# Patient Record
Sex: Male | Born: 1975 | Race: White | Hispanic: Yes | Smoking: Never smoker
Health system: Southern US, Community
[De-identification: ages and names within clinical notes are randomized; demographics above are authoritative.]

## PROBLEM LIST (undated history)

## (undated) DIAGNOSIS — R319 Hematuria, unspecified: Secondary | ICD-10-CM

## (undated) DIAGNOSIS — E7800 Pure hypercholesterolemia, unspecified: Secondary | ICD-10-CM

## (undated) DIAGNOSIS — E119 Type 2 diabetes mellitus without complications: Secondary | ICD-10-CM

---

## 2010-10-09 ENCOUNTER — Emergency Department (HOSPITAL_COMMUNITY): Payer: Self-pay

## 2010-10-09 ENCOUNTER — Emergency Department (HOSPITAL_COMMUNITY)
Admission: EM | Admit: 2010-10-09 | Discharge: 2010-10-09 | Disposition: A | Discharge status: Home / Self Care | Payer: Self-pay | Attending: Emergency Medicine | Admitting: Emergency Medicine

## 2010-10-09 DIAGNOSIS — E7800 Pure hypercholesterolemia, unspecified: Secondary | ICD-10-CM | POA: Insufficient documentation

## 2010-10-09 DIAGNOSIS — R071 Chest pain on breathing: Secondary | ICD-10-CM | POA: Insufficient documentation

## 2010-10-09 DIAGNOSIS — Z79899 Other long term (current) drug therapy: Secondary | ICD-10-CM | POA: Insufficient documentation

## 2010-10-09 DIAGNOSIS — H532 Diplopia: Secondary | ICD-10-CM | POA: Insufficient documentation

## 2015-08-12 ENCOUNTER — Encounter (HOSPITAL_COMMUNITY): Payer: Self-pay | Admitting: Emergency Medicine

## 2015-08-12 ENCOUNTER — Emergency Department (HOSPITAL_COMMUNITY): Payer: Medicaid Other

## 2015-08-12 DIAGNOSIS — K3580 Unspecified acute appendicitis: Principal | ICD-10-CM | POA: Diagnosis present

## 2015-08-12 LAB — CBC
HCT: 44.4 % (ref 39.0–52.0)
HEMOGLOBIN: 14.9 g/dL (ref 13.0–17.0)
MCH: 29.9 pg (ref 26.0–34.0)
MCHC: 33.6 g/dL (ref 30.0–36.0)
MCV: 89.2 fL (ref 78.0–100.0)
Platelets: 214 10*3/uL (ref 150–400)
RBC: 4.98 MIL/uL (ref 4.22–5.81)
RDW: 13.8 % (ref 11.5–15.5)
WBC: 14 10*3/uL — AB (ref 4.0–10.5)

## 2015-08-12 LAB — BASIC METABOLIC PANEL
ANION GAP: 13 (ref 5–15)
BUN: 13 mg/dL (ref 6–20)
CALCIUM: 9.5 mg/dL (ref 8.9–10.3)
CO2: 23 mmol/L (ref 22–32)
Chloride: 104 mmol/L (ref 101–111)
Creatinine, Ser: 1.23 mg/dL (ref 0.61–1.24)
GLUCOSE: 150 mg/dL — AB (ref 65–99)
POTASSIUM: 3.9 mmol/L (ref 3.5–5.1)
SODIUM: 140 mmol/L (ref 135–145)

## 2015-08-12 LAB — I-STAT TROPONIN, ED: TROPONIN I, POC: 0 ng/mL (ref 0.00–0.08)

## 2015-08-12 NOTE — ED Notes (Signed)
The patient says he started having chest pain, epigastric pain at 1730.  He says he took some pepto bismol and it did not help.  He rates his pain 5/10.  He denies any other symptoms.

## 2015-08-13 ENCOUNTER — Observation Stay (HOSPITAL_COMMUNITY): Payer: Medicaid Other | Admitting: Anesthesiology

## 2015-08-13 ENCOUNTER — Inpatient Hospital Stay (HOSPITAL_COMMUNITY)
Admission: EM | Admit: 2015-08-13 | Discharge: 2015-08-14 | DRG: 343 | Disposition: A | Discharge status: Home / Self Care | Payer: Medicaid Other | Attending: General Surgery | Admitting: General Surgery

## 2015-08-13 ENCOUNTER — Encounter (HOSPITAL_COMMUNITY): Payer: Self-pay | Admitting: Radiology

## 2015-08-13 ENCOUNTER — Encounter (HOSPITAL_COMMUNITY): Admission: AD | Disposition: A | Discharge status: Home / Self Care | Payer: Self-pay | Source: Home / Self Care

## 2015-08-13 ENCOUNTER — Emergency Department (HOSPITAL_COMMUNITY): Payer: Medicaid Other

## 2015-08-13 DIAGNOSIS — K3580 Unspecified acute appendicitis: Secondary | ICD-10-CM | POA: Diagnosis present

## 2015-08-13 HISTORY — PX: LAPAROSCOPIC APPENDECTOMY: SHX408

## 2015-08-13 LAB — HEPATIC FUNCTION PANEL
ALT: 28 U/L (ref 17–63)
AST: 38 U/L (ref 15–41)
Albumin: 4.2 g/dL (ref 3.5–5.0)
Alkaline Phosphatase: 62 U/L (ref 38–126)
BILIRUBIN DIRECT: 0.2 mg/dL (ref 0.1–0.5)
BILIRUBIN TOTAL: 0.7 mg/dL (ref 0.3–1.2)
Indirect Bilirubin: 0.5 mg/dL (ref 0.3–0.9)
Total Protein: 7.3 g/dL (ref 6.5–8.1)

## 2015-08-13 LAB — SURGICAL PCR SCREEN
MRSA, PCR: NEGATIVE
STAPHYLOCOCCUS AUREUS: NEGATIVE

## 2015-08-13 LAB — URINALYSIS, ROUTINE W REFLEX MICROSCOPIC
BILIRUBIN URINE: NEGATIVE
GLUCOSE, UA: NEGATIVE mg/dL
HGB URINE DIPSTICK: NEGATIVE
KETONES UR: NEGATIVE mg/dL
Leukocytes, UA: NEGATIVE
Nitrite: NEGATIVE
PH: 5.5 (ref 5.0–8.0)
Protein, ur: NEGATIVE mg/dL
SPECIFIC GRAVITY, URINE: 1.035 — AB (ref 1.005–1.030)

## 2015-08-13 LAB — LIPASE, BLOOD: LIPASE: 32 U/L (ref 11–51)

## 2015-08-13 SURGERY — APPENDECTOMY, LAPAROSCOPIC
Anesthesia: General | Site: Abdomen

## 2015-08-13 MED ORDER — ACETAMINOPHEN 500 MG PO TABS
500.0000 mg | ORAL_TABLET | Freq: Four times a day (QID) | ORAL | Status: DC | PRN
  Administered 2015-08-13: 500 mg via ORAL
  Filled 2015-08-13: qty 1

## 2015-08-13 MED ORDER — PROPOFOL 10 MG/ML IV BOLUS
INTRAVENOUS | Status: AC
  Filled 2015-08-13: qty 20

## 2015-08-13 MED ORDER — DEXTROSE 5 % IV SOLN
1.0000 g | Freq: Once | INTRAVENOUS | Status: AC
  Administered 2015-08-13: 1 g via INTRAVENOUS
  Filled 2015-08-13: qty 10

## 2015-08-13 MED ORDER — LACTATED RINGERS IV SOLN
INTRAVENOUS | Status: DC | PRN
Start: 2015-08-13 — End: 2015-08-13
  Administered 2015-08-13 (×2): via INTRAVENOUS

## 2015-08-13 MED ORDER — MORPHINE SULFATE (PF) 4 MG/ML IV SOLN
4.0000 mg | Freq: Once | INTRAVENOUS | Status: AC
  Administered 2015-08-13: 4 mg via INTRAVENOUS
  Filled 2015-08-13: qty 1

## 2015-08-13 MED ORDER — ROCURONIUM BROMIDE 100 MG/10ML IV SOLN
INTRAVENOUS | Status: DC | PRN
  Administered 2015-08-13: 30 mg via INTRAVENOUS
  Administered 2015-08-13: 5 mg via INTRAVENOUS

## 2015-08-13 MED ORDER — CEFTRIAXONE SODIUM 2 G IJ SOLR: 2.0000 g | INTRAMUSCULAR | Status: DC

## 2015-08-13 MED ORDER — PROMETHAZINE HCL 25 MG/ML IJ SOLN: 6.2500 mg | INTRAMUSCULAR | Status: DC | PRN

## 2015-08-13 MED ORDER — ARTIFICIAL TEARS OP OINT
TOPICAL_OINTMENT | OPHTHALMIC | Status: AC
  Filled 2015-08-13: qty 3.5

## 2015-08-13 MED ORDER — GLYCOPYRROLATE 0.2 MG/ML IJ SOLN
INTRAMUSCULAR | Status: DC | PRN
  Administered 2015-08-13: 0.2 mg via INTRAVENOUS

## 2015-08-13 MED ORDER — METRONIDAZOLE IN NACL 5-0.79 MG/ML-% IV SOLN
500.0000 mg | Freq: Once | INTRAVENOUS | Status: AC
  Administered 2015-08-13: 500 mg via INTRAVENOUS
  Filled 2015-08-13: qty 100

## 2015-08-13 MED ORDER — BUPIVACAINE-EPINEPHRINE (PF) 0.25% -1:200000 IJ SOLN
INTRAMUSCULAR | Status: AC
  Filled 2015-08-13: qty 30

## 2015-08-13 MED ORDER — LACTATED RINGERS IV SOLN
INTRAVENOUS | Status: DC
  Administered 2015-08-13: 50 mL/h via INTRAVENOUS

## 2015-08-13 MED ORDER — SUGAMMADEX SODIUM 200 MG/2ML IV SOLN
INTRAVENOUS | Status: DC | PRN
  Administered 2015-08-13: 188.2 mg via INTRAVENOUS

## 2015-08-13 MED ORDER — LIDOCAINE HCL (CARDIAC) 20 MG/ML IV SOLN
INTRAVENOUS | Status: AC
  Filled 2015-08-13: qty 10

## 2015-08-13 MED ORDER — SUCCINYLCHOLINE CHLORIDE 20 MG/ML IJ SOLN
INTRAMUSCULAR | Status: DC | PRN
  Administered 2015-08-13: 120 mg via INTRAVENOUS

## 2015-08-13 MED ORDER — HYDROMORPHONE HCL 1 MG/ML IJ SOLN
INTRAMUSCULAR | Status: AC
  Administered 2015-08-13: 14:00:00
  Filled 2015-08-13: qty 1

## 2015-08-13 MED ORDER — ONDANSETRON HCL 4 MG/2ML IJ SOLN
4.0000 mg | Freq: Once | INTRAMUSCULAR | Status: AC
  Administered 2015-08-13: 4 mg via INTRAVENOUS
  Filled 2015-08-13: qty 2

## 2015-08-13 MED ORDER — GLYCOPYRROLATE 0.2 MG/ML IJ SOLN
INTRAMUSCULAR | Status: AC
  Filled 2015-08-13: qty 3

## 2015-08-13 MED ORDER — SODIUM CHLORIDE 0.9 % IV SOLN
INTRAVENOUS | Status: DC
  Administered 2015-08-13 – 2015-08-14 (×2): via INTRAVENOUS

## 2015-08-13 MED ORDER — SODIUM CHLORIDE 0.9 % IV SOLN
INTRAVENOUS | Status: DC
  Administered 2015-08-13 (×2): via INTRAVENOUS

## 2015-08-13 MED ORDER — HYDROMORPHONE HCL 1 MG/ML IJ SOLN: 1.0000 mg | INTRAMUSCULAR | Status: DC | PRN

## 2015-08-13 MED ORDER — PROPOFOL 10 MG/ML IV BOLUS
INTRAVENOUS | Status: DC | PRN
  Administered 2015-08-13: 200 mg via INTRAVENOUS

## 2015-08-13 MED ORDER — LIDOCAINE HCL (CARDIAC) 20 MG/ML IV SOLN
INTRAVENOUS | Status: DC | PRN
  Administered 2015-08-13: 80 mg via INTRAVENOUS

## 2015-08-13 MED ORDER — SUCCINYLCHOLINE CHLORIDE 20 MG/ML IJ SOLN
INTRAMUSCULAR | Status: AC
  Filled 2015-08-13: qty 1

## 2015-08-13 MED ORDER — BUPIVACAINE-EPINEPHRINE 0.25% -1:200000 IJ SOLN
INTRAMUSCULAR | Status: DC | PRN
  Administered 2015-08-13: 7 mL

## 2015-08-13 MED ORDER — 0.9 % SODIUM CHLORIDE (POUR BTL) OPTIME
TOPICAL | Status: DC | PRN
Start: 2015-08-13 — End: 2015-08-13
  Administered 2015-08-13: 1000 mL

## 2015-08-13 MED ORDER — PANTOPRAZOLE SODIUM 40 MG IV SOLR
40.0000 mg | Freq: Every day | INTRAVENOUS | Status: DC
  Administered 2015-08-13: 40 mg via INTRAVENOUS
  Filled 2015-08-13: qty 40

## 2015-08-13 MED ORDER — SUGAMMADEX SODIUM 200 MG/2ML IV SOLN
INTRAVENOUS | Status: AC
  Filled 2015-08-13: qty 2

## 2015-08-13 MED ORDER — CEFTRIAXONE SODIUM 2 G IJ SOLR
2.0000 g | INTRAMUSCULAR | Status: DC
  Administered 2015-08-14: 2 g via INTRAVENOUS
  Filled 2015-08-13: qty 2

## 2015-08-13 MED ORDER — METRONIDAZOLE IN NACL 5-0.79 MG/ML-% IV SOLN
500.0000 mg | Freq: Three times a day (TID) | INTRAVENOUS | Status: DC
  Filled 2015-08-13: qty 100

## 2015-08-13 MED ORDER — SODIUM CHLORIDE 0.9 % IR SOLN
Status: DC | PRN
  Administered 2015-08-13: 1000 mL

## 2015-08-13 MED ORDER — FENTANYL CITRATE (PF) 250 MCG/5ML IJ SOLN
INTRAMUSCULAR | Status: AC
  Filled 2015-08-13: qty 5

## 2015-08-13 MED ORDER — MIDAZOLAM HCL 2 MG/2ML IJ SOLN
INTRAMUSCULAR | Status: AC
  Filled 2015-08-13: qty 2

## 2015-08-13 MED ORDER — ONDANSETRON HCL 4 MG/2ML IJ SOLN
4.0000 mg | Freq: Four times a day (QID) | INTRAMUSCULAR | Status: DC | PRN
  Administered 2015-08-13: 4 mg via INTRAVENOUS
  Filled 2015-08-13: qty 2

## 2015-08-13 MED ORDER — PROMETHAZINE HCL 25 MG/ML IJ SOLN
12.5000 mg | Freq: Four times a day (QID) | INTRAMUSCULAR | Status: DC | PRN
  Administered 2015-08-13: 12.5 mg via INTRAVENOUS
  Filled 2015-08-13: qty 1

## 2015-08-13 MED ORDER — METRONIDAZOLE IN NACL 5-0.79 MG/ML-% IV SOLN
500.0000 mg | Freq: Three times a day (TID) | INTRAVENOUS | Status: DC
  Administered 2015-08-13 – 2015-08-14 (×3): 500 mg via INTRAVENOUS
  Filled 2015-08-13 (×5): qty 100

## 2015-08-13 MED ORDER — NEOSTIGMINE METHYLSULFATE 10 MG/10ML IV SOLN
INTRAVENOUS | Status: AC
  Filled 2015-08-13: qty 1

## 2015-08-13 MED ORDER — ONDANSETRON 4 MG PO TBDP: 4.0000 mg | ORAL_TABLET | Freq: Four times a day (QID) | ORAL | Status: DC | PRN

## 2015-08-13 MED ORDER — MIDAZOLAM HCL 5 MG/5ML IJ SOLN
INTRAMUSCULAR | Status: DC | PRN
  Administered 2015-08-13: 2 mg via INTRAVENOUS

## 2015-08-13 MED ORDER — ONDANSETRON HCL 4 MG/2ML IJ SOLN
INTRAMUSCULAR | Status: DC | PRN
  Administered 2015-08-13: 4 mg via INTRAVENOUS

## 2015-08-13 MED ORDER — SODIUM CHLORIDE 0.9 % IV SOLN
Freq: Once | INTRAVENOUS | Status: AC
  Administered 2015-08-13: 02:00:00 via INTRAVENOUS

## 2015-08-13 MED ORDER — OXYCODONE-ACETAMINOPHEN 5-325 MG PO TABS
1.0000 | ORAL_TABLET | ORAL | Status: DC | PRN
  Administered 2015-08-13 – 2015-08-14 (×4): 2 via ORAL
  Filled 2015-08-13 (×4): qty 2

## 2015-08-13 MED ORDER — HYDROMORPHONE HCL 1 MG/ML IJ SOLN
0.2500 mg | INTRAMUSCULAR | Status: DC | PRN
  Administered 2015-08-13 (×2): 0.5 mg via INTRAVENOUS

## 2015-08-13 MED ORDER — ONDANSETRON HCL 4 MG/2ML IJ SOLN
INTRAMUSCULAR | Status: AC
  Filled 2015-08-13: qty 2

## 2015-08-13 MED ORDER — IOHEXOL 300 MG/ML  SOLN
100.0000 mL | Freq: Once | INTRAMUSCULAR | Status: AC | PRN
  Administered 2015-08-13: 100 mL via INTRAVENOUS

## 2015-08-13 MED ORDER — FENTANYL CITRATE (PF) 100 MCG/2ML IJ SOLN
INTRAMUSCULAR | Status: DC | PRN
  Administered 2015-08-13: 50 ug via INTRAVENOUS
  Administered 2015-08-13 (×3): 25 ug via INTRAVENOUS
  Administered 2015-08-13: 50 ug via INTRAVENOUS
  Administered 2015-08-13: 25 ug via INTRAVENOUS
  Administered 2015-08-13: 50 ug via INTRAVENOUS

## 2015-08-13 MED ORDER — ROCURONIUM BROMIDE 50 MG/5ML IV SOLN
INTRAVENOUS | Status: AC
  Filled 2015-08-13: qty 1

## 2015-08-13 SURGICAL SUPPLY — 43 items
APPLIER CLIP ROT 10 11.4 M/L (STAPLE)
CANISTER SUCTION 2500CC (MISCELLANEOUS) ×3 IMPLANT
CHLORAPREP W/TINT 26ML (MISCELLANEOUS) ×3 IMPLANT
CLIP APPLIE ROT 10 11.4 M/L (STAPLE) IMPLANT
CLOSURE WOUND 1/2 X4 (GAUZE/BANDAGES/DRESSINGS) ×1
COVER SURGICAL LIGHT HANDLE (MISCELLANEOUS) ×3 IMPLANT
CUTTER FLEX LINEAR 45M (STAPLE) ×3 IMPLANT
DEVICE TROCAR PUNCTURE CLOSURE (ENDOMECHANICALS) ×3 IMPLANT
ELECT REM PT RETURN 9FT ADLT (ELECTROSURGICAL) ×3
ELECTRODE REM PT RTRN 9FT ADLT (ELECTROSURGICAL) ×1 IMPLANT
GLOVE BIO SURGEON STRL SZ7 (GLOVE) ×3 IMPLANT
GLOVE BIO SURGEON STRL SZ7.5 (GLOVE) ×3 IMPLANT
GLOVE BIOGEL PI IND STRL 6.5 (GLOVE) ×1 IMPLANT
GLOVE BIOGEL PI IND STRL 7.5 (GLOVE) ×2 IMPLANT
GLOVE BIOGEL PI INDICATOR 6.5 (GLOVE) ×2
GLOVE BIOGEL PI INDICATOR 7.5 (GLOVE) ×4
GLOVE ECLIPSE 6.5 STRL STRAW (GLOVE) ×3 IMPLANT
GOWN STRL REUS W/ TWL LRG LVL3 (GOWN DISPOSABLE) ×3 IMPLANT
GOWN STRL REUS W/TWL LRG LVL3 (GOWN DISPOSABLE) ×6
KIT BASIN OR (CUSTOM PROCEDURE TRAY) ×3 IMPLANT
KIT ROOM TURNOVER OR (KITS) ×3 IMPLANT
LIQUID BAND (GAUZE/BANDAGES/DRESSINGS) ×3 IMPLANT
NS IRRIG 1000ML POUR BTL (IV SOLUTION) ×3 IMPLANT
PAD ARMBOARD 7.5X6 YLW CONV (MISCELLANEOUS) ×6 IMPLANT
POUCH RETRIEVAL ECOSAC 10 (ENDOMECHANICALS) ×1 IMPLANT
POUCH RETRIEVAL ECOSAC 10MM (ENDOMECHANICALS) ×2
RELOAD 45 VASCULAR/THIN (ENDOMECHANICALS) ×3 IMPLANT
RELOAD STAPLE TA45 3.5 REG BLU (ENDOMECHANICALS) ×3 IMPLANT
SCALPEL HARMONIC ACE (MISCELLANEOUS) ×3 IMPLANT
SCISSORS LAP 5X35 DISP (ENDOMECHANICALS) IMPLANT
SET IRRIG TUBING LAPAROSCOPIC (IRRIGATION / IRRIGATOR) ×3 IMPLANT
SLEEVE ENDOPATH XCEL 5M (ENDOMECHANICALS) ×3 IMPLANT
SPECIMEN JAR SMALL (MISCELLANEOUS) ×3 IMPLANT
STRIP CLOSURE SKIN 1/2X4 (GAUZE/BANDAGES/DRESSINGS) ×2 IMPLANT
SUT MNCRL AB 4-0 PS2 18 (SUTURE) ×3 IMPLANT
SUT VICRYL 0 UR6 27IN ABS (SUTURE) ×3 IMPLANT
TOWEL OR 17X24 6PK STRL BLUE (TOWEL DISPOSABLE) ×3 IMPLANT
TOWEL OR 17X26 10 PK STRL BLUE (TOWEL DISPOSABLE) ×3 IMPLANT
TRAY FOLEY CATH 16FR SILVER (SET/KITS/TRAYS/PACK) ×3 IMPLANT
TRAY LAPAROSCOPIC MC (CUSTOM PROCEDURE TRAY) ×3 IMPLANT
TROCAR XCEL BLUNT TIP 100MML (ENDOMECHANICALS) ×3 IMPLANT
TROCAR XCEL NON-BLD 5MMX100MML (ENDOMECHANICALS) ×3 IMPLANT
TUBING INSUFFLATION (TUBING) ×3 IMPLANT

## 2015-08-13 NOTE — ED Notes (Signed)
Notified CT patient is ready, spoke to [place].

## 2015-08-13 NOTE — Interval H&P Note (Signed)
History and Physical Interval Note:  08/13/2015 10:53 AM  Tristan Hall  has presented today for surgery, with the diagnosis of appendicitis  The various methods of treatment have been discussed with the patient and family. After consideration of risks, benefits and other options for treatment, the patient has consented to  Procedure(s): APPENDECTOMY LAPAROSCOPIC (N/A) as a surgical intervention .  The patient's history has been reviewed, patient examined, no change in status, stable for surgery.  I have reviewed the patient's chart and labs.  Questions were answered to the patient's satisfaction.     Muntaha Vermette

## 2015-08-13 NOTE — Transfer of Care (Signed)
Immediate Anesthesia Transfer of Care Note  Patient: Tristan Hall  Procedure(s) Performed: Procedure(s): APPENDECTOMY LAPAROSCOPIC (N/A)  Patient Location: PACU  Anesthesia Type:General  Level of Consciousness: awake and alert   Airway & Oxygen Therapy: Patient Spontanous Breathing and Patient connected to nasal cannula oxygen  Post-op Assessment: Report given to RN, Post -op Vital signs reviewed and stable and Patient moving all extremities  Post vital signs: Reviewed and stable  Last Vitals:  Filed Vitals:   08/13/15 0430 08/13/15 0503  BP: 121/69 120/72  Pulse: 44 43  Temp:  36.5 C  Resp: 20 20    Complications: No apparent anesthesia complications

## 2015-08-13 NOTE — Anesthesia Postprocedure Evaluation (Signed)
Anesthesia Post Note  Patient: Tristan Hall  Procedure(s) Performed: Procedure(s) (LRB): APPENDECTOMY LAPAROSCOPIC (N/A)  Patient location during evaluation: PACU Anesthesia Type: General Level of consciousness: awake Pain management: pain level controlled Vital Signs Assessment: post-procedure vital signs reviewed and stable Respiratory status: spontaneous breathing Cardiovascular status: stable Anesthetic complications: no    Last Vitals:  Filed Vitals:   08/13/15 1220 08/13/15 1235  BP: 147/94 134/63  Pulse: 91 94  Temp:    Resp: 28 24    Last Pain:  Filed Vitals:   08/13/15 1236  PainSc: 0-No pain                 EDWARDS,Alin Hutchins

## 2015-08-13 NOTE — ED Notes (Signed)
Surgeon at the bedside.

## 2015-08-13 NOTE — Anesthesia Procedure Notes (Signed)
Procedure Name: Intubation Date/Time: 08/13/2015 11:24 AM Performed by: Edmonia Caprio Pre-anesthesia Checklist: Patient identified, Timeout performed, Emergency Drugs available, Patient being monitored and Suction available Patient Re-evaluated:Patient Re-evaluated prior to inductionOxygen Delivery Method: Circle system utilized Preoxygenation: Pre-oxygenation with 100% oxygen Intubation Type: IV induction, Rapid sequence and Cricoid Pressure applied Laryngoscope Size: Miller and 2 Grade View: Grade I Tube type: Oral Tube size: 7.5 mm Number of attempts: 1 Airway Equipment and Method: Stylet Placement Confirmation: ETT inserted through vocal cords under direct vision,  breath sounds checked- equal and bilateral and positive ETCO2 Secured at: 22 cm Tube secured with: Tape Dental Injury: Teeth and Oropharynx as per pre-operative assessment

## 2015-08-13 NOTE — H&P (Signed)
General Surgery [facility]  Chief Complaint  Patient presents with  . Abdominal Pain    The patient says he started having epigastric pain at 1730.  He says he took some pepto bismol and it did not help.    HISTORY: This is a 40 year old Hispanic gentleman who presents to the emergency department with right lower quadrant pain.  His son is with him and translates throughout the encounter.     The patient has been healthy.  He developed right lower quadrant pain at 5:30 PM yesterday.  The pain has been progressive and moderately severe.  He denies nausea vomiting or diarrhea.  The pain persists.  No prior similar episodes.  No history of gastrointestinal disorders.     Lab work reveals WBC 14,000.  CT scan shows a dilated and mildly inflamed appendix but no sign of any rupture or abscess and no other intra-abdominal process noted.     He is being admitted for IV fluids, IV antibiotics, and the plan is to taken to the operating room for appendectomy this morning.  History reviewed. No pertinent past medical history.  Current Facility-Administered Medications  Medication Dose Route Frequency Provider Last Rate Last Dose  . 0.9 %  sodium chloride infusion   Intravenous Continuous Claud Kelp, MD      . cefTRIAXone (ROCEPHIN) 1 g in dextrose 5 % 50 mL IVPB  1 g Intravenous Once Dione Booze, MD 100 mL/hr at 08/13/15 0404 1 g at 08/13/15 0404  . cefTRIAXone (ROCEPHIN) 2 g in dextrose 5 % 50 mL IVPB  2 g Intravenous Q24H Claud Kelp, MD       And  . metroNIDAZOLE (FLAGYL) IVPB 500 mg  500 mg Intravenous Q8H Claud Kelp, MD      . HYDROmorphone (DILAUDID) injection 1 mg  1 mg Intravenous Q2H PRN Claud Kelp, MD      . metroNIDAZOLE (FLAGYL) IVPB 500 mg  500 mg Intravenous Once Dione Booze, MD      . ondansetron (ZOFRAN-ODT) disintegrating tablet 4 mg  4 mg Oral Q6H PRN Claud Kelp, MD       Or  . ondansetron [facility]) injection 4 mg  4 mg Intravenous Q6H PRN  Claud Kelp, MD      . pantoprazole (PROTONIX) injection 40 mg  40 mg Intravenous QHS Claud Kelp, MD       No current outpatient prescriptions on file.    No Known Allergies  History reviewed. No pertinent family history.  Social History   Social History  . Marital Status: Married    Spouse Name: N/A  . Number of Children: N/A  . Years of Education: N/A   Social History Main Topics  . Smoking status: Never Smoker   . Smokeless tobacco: None  . Alcohol Use: Yes     Comment: occ  . Drug Use: None  . Sexual Activity: Not Asked   Other Topics Concern  . None   Social History Narrative    REVIEW OF SYSTEMS - PERTINENT POSITIVES ONLY:   EXAM: Filed Vitals:   08/13/15 0145 08/13/15 0215  BP: 109/63 131/69  Pulse: 62 53  Temp:    Resp: 26 18    GENERAL: well-developed, well-nourished, no acute distress HEENT: normocephalic; pupils equal and reactive; sclerae clear; dentition good; mucous membranes moist NECK:  symmetric on extension; no palpable anterior or posterior cervical lymphadenopathy; no supraclavicular masses; no tenderness CHEST: clear to auscultation bilaterally without rales, rhonchi, or wheezes CARDIAC:  regular rate and rhythm without significant murmur; peripheral pulses are full ABDOMEN: Soft, not distended.  Localized tenderness with involuntary guarding right lower quadrant.  No mass.  No incisions.  No hernias.  No inguinal mass. EXT:  non-tender without edema; no deformity NEURO: no gross focal deficits; no sign of tremor   LABORATORY RESULTS: See [facility] (CHL-Epic) for most recent results  RADIOLOGY RESULTS: See [facility] (CHL-Epic) for most recent results  IMPRESSION: Acute appendicitis  PLAN: The patient will be admitted to observation IV fluids, ceftriaxone and Flagyl antibiotics NPO The patient will be taken to the operating room this morning, most likely by Dr. Emelia Loron, for laparoscopic appendectomy,  possible open appendectomy  I discussed the indications, details, techniques, and numerous risk of the surgery with him and his son.  He is aware the risk of bleeding, infection, conversion to open laparotomy, wound healing problems such as hernia, injury to adjacent organs with major reconstructive surgery, cardiac, pulmonary, and thromboembolic problems.  He understands these issues well.  At this time all of his questions are answered.  Agrees with this plan.  Angelia Mould. Derrell Lolling, M.D., [facility] General and Minimally invasive Surgery Breast and Colorectal Surgery Office:   [phone] Pager:   [phone] .

## 2015-08-13 NOTE — Anesthesia Preprocedure Evaluation (Addendum)
Anesthesia Evaluation  Patient identified by MRN, date of birth, ID band Patient awake    Reviewed: Allergy & Precautions, NPO status   Airway Mallampati: II  TM Distance: >3 FB Neck ROM: Full    Dental  (+) Teeth Intact, Dental Advisory Given   Pulmonary neg pulmonary ROS,    breath sounds clear to auscultation       Cardiovascular negative cardio ROS   Rhythm:Regular Rate:Normal     Neuro/Psych    GI/Hepatic Neg liver ROS, GI history noted. CE   Endo/Other  negative endocrine ROS  Renal/GU      Musculoskeletal   Abdominal   Peds  Hematology   Anesthesia Other Findings   Reproductive/Obstetrics                            Anesthesia Physical Anesthesia Plan  ASA: II  Anesthesia Plan: General   Post-op Pain Management:    Induction: Intravenous, Rapid sequence and Cricoid pressure planned  Airway Management Planned: Oral ETT  Additional Equipment:   Intra-op Plan:   Post-operative Plan: Extubation in OR  Informed Consent: I have reviewed the patients History and Physical, chart, labs and discussed the procedure including the risks, benefits and alternatives for the proposed anesthesia with the patient or authorized representative who has indicated his/her understanding and acceptance.   Dental advisory given  Plan Discussed with: Anesthesiologist and CRNA  Anesthesia Plan Comments:         Anesthesia Quick Evaluation

## 2015-08-13 NOTE — Op Note (Signed)
Preoperative diagnosis: acute suppurative appendicitis Postoperative diagnosis: same as above Procedure: laparoscopic appendectomy Surgeon: Dr Harden Mo Anesthesia: general EBL: minimal Drains none Specimen appendix to pathology Complications: none Sponge count correct at completion Disposition to recovery stable  Indications: This is a 39 yom with rlq pain and ct with appendicitis. We discussed laparoscopic appendectomy.   Procedure: After informed consent was obtained the patient was taken to the operating room. He was already given antibiotics. Sequential compression devices were on his legs.He was placed under general anesthesia without complication. His abdomen was prepped and draped in the standard sterile surgical fashion. A surgical timeout was then performed. A foley catheter was placed.   I infiltrated marcaine below the umbilicus. I made an incision and then entered the fascia sharply. I then entered the peritoneum bluntly. I placed a 0 vicryl pursestring suture and inserted a hasson trocar.I then inserted 2 further 5 mm trocars in the suprapubic region and the left mid abdomen. He had acute suppurative appendicitis that was not perforated. I saw the TI into the right colon. I then dissected it free from the cecum. I divided the mesoappendix with the harmonic scalpel. The appendiceal artery was divided. I then divided the appendix with the gia stapler. I then placed this in a bag and removed it from the abdomen.  I then obtained hemostasis and irrigated. I then removed the umbilical trocar and closed with 0 vicryl and the endoclose device after tying down the pursestring.  I then desufflated the abdomen and removed all my remaining trocars. I then closed these with 4-0 Monocryl and Dermabond. He tolerated this well was extubated and transferred to the recovery room in stable condition

## 2015-08-13 NOTE — ED Provider Notes (Signed)
CSN: [id number]     Arrival date & time 08/12/15  2204 History  By signing my name below, I, Freida Busman, attest that this documentation has been prepared under the direction and in the presence of Dione Booze, MD . Electronically Signed: Freida Busman, Scribe. 08/13/2015. 1:42 AM.    Chief Complaint  Patient presents with  . Abdominal Pain    The patient says he started having epigastric pain at 1730.  He says he took some pepto bismol and it did not help.     The history is provided by the patient. No language interpreter was used.   HPI Comments:  Tristan Hall is a 40 y.o. male who presents to the Emergency Department complaining of epigastric pain which began ~1730 yesterday (08/12/15). He notes the pain has moved to his RUQ. His pain at this time is a 7/10. He denies nausea and fever. Pt notes his pain is occasionally exacerbated when ambulating. No alleviating factors noted. Pt denies significant PMHx.  History reviewed. No pertinent past medical history. History reviewed. No pertinent past surgical history. History reviewed. No pertinent family history. Social History  Substance Use Topics  . Smoking status: Never Smoker   . Smokeless tobacco: None  . Alcohol Use: Yes     Comment: occ    Review of Systems  Constitutional: Negative for fever.  Gastrointestinal: Positive for abdominal pain. Negative for nausea and vomiting.  All other systems reviewed and are negative.   Allergies  Review of patient's allergies indicates no known allergies.  Home Medications   Prior to Admission medications   Not on File   BP 127/76 mmHg  Pulse 50  Temp(Src) 99 F (37.2 C) (Oral)  Resp 12  SpO2 97% Physical Exam  Constitutional: He is oriented to person, place, and time. He appears well-developed and well-nourished. No distress.  HENT:  Head: Normocephalic and atraumatic.  Eyes: Conjunctivae are normal. Pupils are equal, round, and reactive to light.  Neck: Normal range  of motion. Neck supple. No JVD present.  Cardiovascular: Normal rate and regular rhythm.   No murmur heard. Pulmonary/Chest: Effort normal and breath sounds normal. He has no wheezes. He has no rales. He exhibits no tenderness.  Abdominal: Soft. He exhibits no distension and no mass. Bowel sounds are decreased. There is tenderness (moderate) in the right upper quadrant, right lower quadrant, epigastric area and suprapubic area. There is no rebound and no guarding.  Musculoskeletal: Normal range of motion. He exhibits no edema.  Lymphadenopathy:    He has no cervical adenopathy.  Neurological: He is alert and oriented to person, place, and time. No cranial nerve deficit. He exhibits normal muscle tone. Coordination normal.  Skin: Skin is warm and dry. No rash noted.  Psychiatric: He has a normal mood and affect. His behavior is normal. Judgment and thought content normal.  Nursing note and vitals reviewed.   ED Course  Procedures   DIAGNOSTIC STUDIES:  Oxygen Saturation is 97% on RA, normal by my interpretation.    COORDINATION OF CARE:  1:37 AM Will order pain meds, IV fluids and CT A/P.  Discussed treatment plan with pt at bedside and pt agreed to plan.  Labs Review Results for orders placed or performed during the hospital encounter of 08/13/15  Basic metabolic panel  Result Value Ref Range   Sodium 140 135 - 145 mmol/L   Potassium 3.9 3.5 - 5.1 mmol/L   Chloride 104 101 - 111 mmol/L   CO2 23 22 -  32 mmol/L   Glucose, Bld 150 (H) 65 - 99 mg/dL   BUN 13 6 - 20 mg/dL   Creatinine, Ser 1.61 0.61 - 1.24 mg/dL   Calcium 9.5 8.9 - 09.6 mg/dL   GFR calc non Af Amer >60 >60 mL/min   GFR calc Af Amer >60 >60 mL/min   Anion gap 13 5 - 15  CBC  Result Value Ref Range   WBC 14.0 (H) 4.0 - 10.5 K/uL   RBC 4.98 4.22 - 5.81 MIL/uL   Hemoglobin 14.9 13.0 - 17.0 g/dL   HCT 04.5 40.9 - 81.1 %   MCV 89.2 78.0 - 100.0 fL   MCH 29.9 26.0 - 34.0 pg   MCHC 33.6 30.0 - 36.0 g/dL   RDW 91.4  78.2 - 95.6 %   Platelets 214 150 - 400 K/uL  Hepatic function panel  Result Value Ref Range   Total Protein 7.3 6.5 - 8.1 g/dL   Albumin 4.2 3.5 - 5.0 g/dL   AST 38 15 - 41 U/L   ALT 28 17 - 63 U/L   Alkaline Phosphatase 62 38 - 126 U/L   Total Bilirubin 0.7 0.3 - 1.2 mg/dL   Bilirubin, Direct 0.2 0.1 - 0.5 mg/dL   Indirect Bilirubin 0.5 0.3 - 0.9 mg/dL  Lipase, blood  Result Value Ref Range   Lipase 32 11 - 51 U/L  Urinalysis, Routine w reflex microscopic  Result Value Ref Range   Color, Urine YELLOW YELLOW   APPearance CLEAR CLEAR   Specific Gravity, Urine 1.035 (H) 1.005 - 1.030   pH 5.5 5.0 - 8.0   Glucose, UA NEGATIVE NEGATIVE mg/dL   Hgb urine dipstick NEGATIVE NEGATIVE   Bilirubin Urine NEGATIVE NEGATIVE   Ketones, ur NEGATIVE NEGATIVE mg/dL   Protein, ur NEGATIVE NEGATIVE mg/dL   Nitrite NEGATIVE NEGATIVE   Leukocytes, UA NEGATIVE NEGATIVE  I-stat troponin, ED (not at [facility], [facility])  Result Value Ref Range   Troponin i, poc 0.00 0.00 - 0.08 ng/mL   Comment 3           Imaging Review Dg Chest 2 View  08/12/2015  CLINICAL DATA:  40 year old male with chest pain EXAM: CHEST  2 VIEW COMPARISON:  Radiograph dated 10/09/2010 FINDINGS: The heart size and mediastinal contours are within normal limits. Both lungs are clear. The visualized skeletal structures are unremarkable. IMPRESSION: No active cardiopulmonary disease. Electronically Signed   By: Elgie Collard M.D.   On: 08/12/2015 23:03   Ct Abdomen Pelvis W Contrast  08/13/2015  CLINICAL DATA:  Acute onset of epigastric abdominal pain. Initial encounter. EXAM: CT ABDOMEN AND PELVIS WITH CONTRAST TECHNIQUE: Multidetector CT imaging of the abdomen and pelvis was performed using the standard protocol following bolus administration of intravenous contrast. CONTRAST:  OMNIPAQUE IOHEXOL 300 MG/ML  SOLN COMPARISON:  None. FINDINGS: The visualized lung bases are clear. The liver and spleen are unremarkable in appearance. The  gallbladder is within normal limits. The pancreas and adrenal glands are unremarkable. The kidneys are unremarkable in appearance. There is no evidence of hydronephrosis. No renal or ureteral stones are seen. No perinephric stranding is appreciated. No free fluid is identified. The small bowel is unremarkable in appearance. The stomach is within normal limits. No acute vascular abnormalities are seen. The appendix is dilated to 1.2 cm in maximal diameter, with mild soft tissue inflammation about the distal tip of the appendix, compatible with mild acute appendicitis. There is no evidence of perforation or abscess  formation. The colon is grossly unremarkable in appearance. Apparent mild wall thickening at the rectum is thought to reflect intraluminal contents. The bladder is mildly distended and grossly unremarkable. The prostate remains normal in size, with scattered calcification. No inguinal lymphadenopathy is seen. No acute osseous abnormalities are identified. IMPRESSION: Mild acute appendicitis, with dilatation of the appendix to 1.2 cm in maximal diameter, and mild soft tissue inflammation about the distal tip of the appendix. No evidence of perforation or abscess formation. These results were called by telephone at the time of interpretation on 08/13/2015 at 3:26 am to Dr. Dione Booze, who verbally acknowledged these results. Electronically Signed   By: Roanna Raider M.D.   On: 08/13/2015 03:27   I have personally reviewed and evaluated these images and lab results as part of my medical decision-making.   EKG Interpretation   Date/Time:  Wednesday August 12 2015 22:21:22 EST Ventricular Rate:  61 PR Interval:  164 QRS Duration: 96 QT Interval:  402 QTC Calculation: 404 R Axis:   87 Text Interpretation:  Normal sinus rhythm Incomplete right bundle branch  block Cannot rule out Inferior infarct , age undetermined Abnormal ECG  When compared with ECG of 10/09/2010, No significant change was found   Confirmed by [facility]  MD, Britiney Blahnik ([id number]) on 08/13/2015 1:05:19 AM      MDM   Final diagnoses:  Acute appendicitis, unspecified acute appendicitis type    Abdominal pain worrisome for appendicitis. He was sent for CT of abdomen and pelvis which confirms presence of appendicitis. Case is discussed with Dr. Lindie Spruce of general surgery who agrees to admit the patient. Old records were reviewed and he has no relevant past visits.  I personally performed the services described in this documentation, which was scribed in my presence. The recorded information has been reviewed and is accurate.      Dione Booze, MD 08/13/15 [phone]

## 2015-08-13 NOTE — Progress Notes (Signed)
PT ARRIVED IN PACU AT 12:15 REPORT RECEIVED AT 12:20

## 2015-08-13 NOTE — ED Notes (Signed)
Patient is in CT transported by [place]

## 2015-08-14 ENCOUNTER — Encounter (HOSPITAL_COMMUNITY): Payer: Self-pay | Admitting: General Surgery

## 2015-08-14 MED ORDER — OXYCODONE-ACETAMINOPHEN 5-325 MG PO TABS: 1.0000 | ORAL_TABLET | ORAL | Status: DC | PRN

## 2015-08-14 NOTE — Progress Notes (Signed)
IV removed. AVS given to patient and family, understanding verbalized. Belongings packed. Transportation arranged with family.

## 2015-08-14 NOTE — Discharge Instructions (Signed)

## 2015-08-14 NOTE — Discharge Summary (Signed)
Physician Discharge Summary  Patient ID: Tristan Hall MRN: [id number] DOB/AGE: [birth year] 39 y.o.  Admit date: 08/13/2015 Discharge date: 08/14/2015  Admitting Diagnosis: Acute appendicitis   Discharge Diagnosis Patient Active Problem List   Diagnosis Date Noted  . Acute appendicitis 08/13/2015  . Appendicitis, acute 08/13/2015    Consultants none  Imaging: Dg Chest 2 View  08/12/2015  CLINICAL DATA:  40 year old male with chest pain EXAM: CHEST  2 VIEW COMPARISON:  Radiograph dated 10/09/2010 FINDINGS: The heart size and mediastinal contours are within normal limits. Both lungs are clear. The visualized skeletal structures are unremarkable. IMPRESSION: No active cardiopulmonary disease. Electronically Signed   By: Elgie Collard M.D.   On: 08/12/2015 23:03   Ct Abdomen Pelvis W Contrast  08/13/2015  CLINICAL DATA:  Acute onset of epigastric abdominal pain. Initial encounter. EXAM: CT ABDOMEN AND PELVIS WITH CONTRAST TECHNIQUE: Multidetector CT imaging of the abdomen and pelvis was performed using the standard protocol following bolus administration of intravenous contrast. CONTRAST:  OMNIPAQUE IOHEXOL 300 MG/ML  SOLN COMPARISON:  None. FINDINGS: The visualized lung bases are clear. The liver and spleen are unremarkable in appearance. The gallbladder is within normal limits. The pancreas and adrenal glands are unremarkable. The kidneys are unremarkable in appearance. There is no evidence of hydronephrosis. No renal or ureteral stones are seen. No perinephric stranding is appreciated. No free fluid is identified. The small bowel is unremarkable in appearance. The stomach is within normal limits. No acute vascular abnormalities are seen. The appendix is dilated to 1.2 cm in maximal diameter, with mild soft tissue inflammation about the distal tip of the appendix, compatible with mild acute appendicitis. There is no evidence of perforation or abscess formation. The colon is  grossly unremarkable in appearance. Apparent mild wall thickening at the rectum is thought to reflect intraluminal contents. The bladder is mildly distended and grossly unremarkable. The prostate remains normal in size, with scattered calcification. No inguinal lymphadenopathy is seen. No acute osseous abnormalities are identified. IMPRESSION: Mild acute appendicitis, with dilatation of the appendix to 1.2 cm in maximal diameter, and mild soft tissue inflammation about the distal tip of the appendix. No evidence of perforation or abscess formation. These results were called by telephone at the time of interpretation on 08/13/2015 at 3:26 am to Dr. Dione Booze, who verbally acknowledged these results. Electronically Signed   By: Roanna Raider M.D.   On: 08/13/2015 03:27    Procedures Laparoscopic appendectomy---Dr. Marta Lamas Course:  Tristan Hall is a 40 year old Hispanic speaking male who presented to [facility] with abdominal pain.  Workup showed acute appendicitis.  Patient was admitted and underwent procedure listed above.  Tolerated procedure well and was transferred to the floor.  Diet was advanced as tolerated.  On POD#1, the patient was voiding well, tolerating diet, ambulating well, pain well controlled, vital signs stable, incisions c/d/i and felt stable for discharge home.  Medication risks, benefits and therapeutic alternatives were reviewed with the patient.  He verbalizes understanding.  Patient will follow up in our office in 3 weeks and knows to call with questions or concerns.  Declined to have a telephone interpreter   Physical Exam: General:  Alert, NAD, pleasant, comfortable Abd:  Soft, ND, mild tenderness, incisions C/D/I    Medication List    TAKE these medications        oxyCODONE-acetaminophen 5-325 MG tablet  Commonly known as:  PERCOCET/ROXICET  Take 1-2 tablets by mouth every 4 (four) hours as  needed for moderate pain.             Follow-up  Information    Follow up with [facility] On 09/09/2015.   Specialty:  General Surgery   Why:  arrive by 8:15AM for a 8:45AM post op check   Contact information:   [address] [place] [address] [phone]       Signed: Ashok Norris, [facility] [phone]  08/14/2015, 8:56 AM

## 2015-08-23 ENCOUNTER — Encounter (HOSPITAL_COMMUNITY): Payer: Self-pay | Admitting: *Deleted

## 2015-08-23 ENCOUNTER — Emergency Department (HOSPITAL_COMMUNITY)
Admission: EM | Admit: 2015-08-23 | Discharge: 2015-08-23 | Disposition: A | Discharge status: Home / Self Care | Payer: Self-pay | Attending: Emergency Medicine | Admitting: Emergency Medicine

## 2015-08-23 ENCOUNTER — Emergency Department (HOSPITAL_COMMUNITY): Payer: MEDICAID

## 2015-08-23 DIAGNOSIS — S301XXA Contusion of abdominal wall, initial encounter: Secondary | ICD-10-CM

## 2015-08-23 DIAGNOSIS — K91870 Postprocedural hematoma of a digestive system organ or structure following a digestive system procedure: Secondary | ICD-10-CM | POA: Insufficient documentation

## 2015-08-23 LAB — COMPREHENSIVE METABOLIC PANEL
ALT: 26 U/L (ref 17–63)
AST: 26 U/L (ref 15–41)
Albumin: 4.4 g/dL (ref 3.5–5.0)
Alkaline Phosphatase: 62 U/L (ref 38–126)
Anion gap: 10 (ref 5–15)
BUN: 11 mg/dL (ref 6–20)
CO2: 26 mmol/L (ref 22–32)
Calcium: 10 mg/dL (ref 8.9–10.3)
Chloride: 103 mmol/L (ref 101–111)
Creatinine, Ser: 0.87 mg/dL (ref 0.61–1.24)
GFR calc Af Amer: >60 mL/min (ref >60)
GFR calc non Af Amer: >60 mL/min (ref >60)
Glucose, Bld: 81 mg/dL (ref 65–99)
Potassium: 4.2 mmol/L (ref 3.5–5.1)
Sodium: 139 mmol/L (ref 135–145)
Total Bilirubin: 0.6 mg/dL (ref 0.3–1.2)
Total Protein: 8.1 g/dL (ref 6.5–8.1)

## 2015-08-23 LAB — URINALYSIS, ROUTINE W REFLEX MICROSCOPIC
Bilirubin Urine: NEGATIVE
Glucose, UA: NEGATIVE mg/dL
Hgb urine dipstick: NEGATIVE
Ketones, ur: NEGATIVE mg/dL
Leukocytes, UA: NEGATIVE
Nitrite: NEGATIVE
Protein, ur: NEGATIVE mg/dL
Specific Gravity, Urine: 1.015 (ref 1.005–1.030)
pH: 6.5 (ref 5.0–8.0)

## 2015-08-23 LAB — CBC WITH DIFFERENTIAL/PLATELET
Basophils Absolute: 0 10*3/uL (ref 0.0–0.1)
Basophils Relative: 1 %
Eosinophils Absolute: 0.2 10*3/uL (ref 0.0–0.7)
Eosinophils Relative: 2 %
HCT: 46.8 % (ref 39.0–52.0)
Hemoglobin: 15.4 g/dL (ref 13.0–17.0)
Lymphocytes Relative: 38 %
Lymphs Abs: 2.3 10*3/uL (ref 0.7–4.0)
MCH: 29.1 pg (ref 26.0–34.0)
MCHC: 32.9 g/dL (ref 30.0–36.0)
MCV: 88.5 fL (ref 78.0–100.0)
Monocytes Absolute: 0.5 10*3/uL (ref 0.1–1.0)
Monocytes Relative: 7 %
Neutro Abs: 3.3 10*3/uL (ref 1.7–7.7)
Neutrophils Relative %: 52 %
Platelets: 250 10*3/uL (ref 150–400)
RBC: 5.29 MIL/uL (ref 4.22–5.81)
RDW: 13.4 % (ref 11.5–15.5)
WBC: 6.2 10*3/uL (ref 4.0–10.5)

## 2015-08-23 MED ORDER — MORPHINE SULFATE (PF) 4 MG/ML IV SOLN
4.0000 mg | Freq: Once | INTRAVENOUS | Status: AC
  Administered 2015-08-23: 4 mg via INTRAVENOUS
  Filled 2015-08-23: qty 1

## 2015-08-23 MED ORDER — IOHEXOL 300 MG/ML  SOLN: 25.0000 mL | INTRAMUSCULAR | Status: AC

## 2015-08-23 MED ORDER — HYDROCODONE-ACETAMINOPHEN 5-325 MG PO TABS
1.0000 | ORAL_TABLET | Freq: Four times a day (QID) | ORAL | Status: DC | PRN
Start: 2015-08-23 — End: 2018-12-08

## 2015-08-23 MED ORDER — IBUPROFEN 800 MG PO TABS: 800.0000 mg | ORAL_TABLET | Freq: Three times a day (TID) | ORAL | Status: DC | PRN

## 2015-08-23 MED ORDER — IOHEXOL 300 MG/ML  SOLN
100.0000 mL | Freq: Once | INTRAMUSCULAR | Status: AC | PRN
  Administered 2015-08-23: 100 mL via INTRAVENOUS

## 2015-08-23 MED ORDER — SODIUM CHLORIDE 0.9 % IV BOLUS (SEPSIS)
1000.0000 mL | Freq: Once | INTRAVENOUS | Status: AC
  Administered 2015-08-23: 1000 mL via INTRAVENOUS

## 2015-08-23 NOTE — ED Notes (Signed)
Pt reports having his appendix removed on Thursday. Pt reports having pain, swelling and tenderness to LLQ site. Pt denies drainage from site.

## 2015-08-23 NOTE — ED Provider Notes (Signed)
CSN: [id number]     Arrival date & time 08/23/15  1139 History   First MD Initiated Contact with Patient 08/23/15 1534     Chief Complaint  Patient presents with  . Post-op Problem     (Consider location/radiation/quality/duration/timing/severity/associated sxs/prior Treatment) HPI Patient presents to the Emergency Department complaining of paid, swelling, and LLQ tenderness. He is a [place] speaking male. Patient had an appendectomy on 08/13/15. His post operative course was been fairly benign and patient does not report many complications. However, the patient states that starting on Tuesday, he has increased LLQ pain, patient states that there is a knot present under the scar. He is currently taking his oxycodone for the pain. The pain is primarily located over one of incisions located in the LLQ. He describes it as a sharp pain, that worsens with palpation. He states there is bruising, edema, and that there is a knot that formed directly under the scar. He is currently taking his oxycodone for the pain. He endorses occasional constipation, but had a BM yesterday. The patient denies fever, chills, diaphoresis, night sweats, cough, congestion, diarrhea, nausea, vomiting, SOB, chest pain, dysuria, weakness, lightheadedness, dizziness, testicular pain, discharge or bleeding from the wound. History reviewed. No pertinent past medical history. Past Surgical History  Procedure Laterality Date  . Laparoscopic appendectomy N/A 08/13/2015    Procedure: APPENDECTOMY LAPAROSCOPIC;  Surgeon: Emelia Loron, MD;  Location: [facility] OR;  Service: General;  Laterality: N/A;   No family history on file. Social History  Substance Use Topics  . Smoking status: Never Smoker   . Smokeless tobacco: None  . Alcohol Use: Yes     Comment: occ    Review of Systems All other systems negative except as documented in the HPI. All pertinent positives and negatives as reviewed in the HPI.  Allergies  Review of  patient's allergies indicates no known allergies.  Home Medications   Prior to Admission medications   Medication Sig Start Date End Date Taking? Authorizing Provider  oxyCODONE-acetaminophen (PERCOCET/ROXICET) 5-325 MG tablet Take 1-2 tablets by mouth every 4 (four) hours as needed for moderate pain. 08/14/15   Emina Riebock, NP   BP 113/72 mmHg  Pulse 56  Temp(Src) 98.2 F (36.8 C) (Oral)  Resp 16  SpO2 96% Physical Exam  Constitutional: He is oriented to person, place, and time. He appears well-developed and well-nourished. No distress.  HENT:  Head: Normocephalic and atraumatic.  Right Ear: External ear normal.  Left Ear: External ear normal.  Eyes: Conjunctivae and EOM are normal. Pupils are equal, round, and reactive to light.  Neck: Normal range of motion. Neck supple.  Cardiovascular: Normal rate, regular rhythm and normal heart sounds.  Exam reveals no gallop and no friction rub.   No murmur heard. Pulmonary/Chest: Effort normal and breath sounds normal. No respiratory distress. He has no wheezes. He has no rales. He exhibits no tenderness.  Abdominal: Soft. Bowel sounds are normal. He exhibits mass (Patient has a small round 1 cm mass under L suprapubic incision ). He exhibits no distension. There is tenderness (Tenderness over incision sites). There is no rebound and no guarding.  Musculoskeletal: Normal range of motion. He exhibits no edema.  Neurological: He is alert and oriented to person, place, and time.  Skin: Skin is warm and dry. He is not diaphoretic.  Patient has a large ecchymosis surrounding the incision site on his LLQ in the L suprapubic region. Patient has some edema in this area. Wounds are dry and  intact.     ED Course  Procedures (including critical care time) Labs Review Labs Reviewed - No data to display  Imaging Review No results found. I have personally reviewed and evaluated these images and lab results as part of my medical decision-making.    EKG Interpretation None     CT Abdomen was ordered at 1751 and there was a delay in scanning. Patient did not get taken  to the CT until 2059.    Patient be discharged home, told to follow-up with the surgeon.  Told to use heat and ice over the area.  She agrees the plan and all questions were answered  Charlestine Night, PA-C 08/23/15 2151  Azalia Bilis, MD 08/23/15 2156

## 2015-08-23 NOTE — Discharge Instructions (Signed)
Return here as needed.  Follow-up with your surgeon. Use heat and ice on the area

## 2017-12-27 ENCOUNTER — Other Ambulatory Visit: Payer: Self-pay

## 2017-12-27 ENCOUNTER — Encounter (HOSPITAL_COMMUNITY): Payer: Self-pay | Admitting: *Deleted

## 2017-12-27 ENCOUNTER — Emergency Department (HOSPITAL_COMMUNITY): Payer: Self-pay

## 2017-12-27 ENCOUNTER — Emergency Department (HOSPITAL_COMMUNITY)
Admission: EM | Admit: 2017-12-27 | Discharge: 2017-12-28 | Disposition: A | Discharge status: Home / Self Care | Payer: Self-pay | Attending: Emergency Medicine | Admitting: Emergency Medicine

## 2017-12-27 DIAGNOSIS — M5416 Radiculopathy, lumbar region: Secondary | ICD-10-CM | POA: Insufficient documentation

## 2017-12-27 HISTORY — DX: Pure hypercholesterolemia, unspecified: E78.00

## 2017-12-27 LAB — CBC WITH DIFFERENTIAL/PLATELET
ABS IMMATURE GRANULOCYTES: 0 10*3/uL (ref 0.0–0.1)
BASOS ABS: 0.1 10*3/uL (ref 0.0–0.1)
Basophils Relative: 1 %
Eosinophils Absolute: 0.2 10*3/uL (ref 0.0–0.7)
Eosinophils Relative: 3 %
HCT: 43.9 % (ref 39.0–52.0)
HEMOGLOBIN: 14.3 g/dL (ref 13.0–17.0)
Immature Granulocytes: 0 %
LYMPHS PCT: 42 %
Lymphs Abs: 2.6 10*3/uL (ref 0.7–4.0)
MCH: 29.9 pg (ref 26.0–34.0)
MCHC: 32.6 g/dL (ref 30.0–36.0)
MCV: 91.6 fL (ref 78.0–100.0)
MONO ABS: 0.5 10*3/uL (ref 0.1–1.0)
Monocytes Relative: 8 %
NEUTROS ABS: 2.9 10*3/uL (ref 1.7–7.7)
Neutrophils Relative %: 46 %
Platelets: 211 10*3/uL (ref 150–400)
RBC: 4.79 MIL/uL (ref 4.22–5.81)
RDW: 13.6 % (ref 11.5–15.5)
WBC: 6.3 10*3/uL (ref 4.0–10.5)

## 2017-12-27 LAB — URINALYSIS, ROUTINE W REFLEX MICROSCOPIC
Bilirubin Urine: NEGATIVE
GLUCOSE, UA: NEGATIVE mg/dL
HGB URINE DIPSTICK: NEGATIVE
KETONES UR: NEGATIVE mg/dL
Leukocytes, UA: NEGATIVE
NITRITE: NEGATIVE
PROTEIN: NEGATIVE mg/dL
Specific Gravity, Urine: 1.021 (ref 1.005–1.030)
pH: 5 (ref 5.0–8.0)

## 2017-12-27 LAB — COMPREHENSIVE METABOLIC PANEL
ALK PHOS: 57 U/L (ref 38–126)
ALT: 45 U/L — AB (ref 0–44)
ANION GAP: 9 (ref 5–15)
AST: 39 U/L (ref 15–41)
Albumin: 4 g/dL (ref 3.5–5.0)
BILIRUBIN TOTAL: 0.5 mg/dL (ref 0.3–1.2)
BUN: 16 mg/dL (ref 6–20)
CALCIUM: 9.5 mg/dL (ref 8.9–10.3)
CO2: 24 mmol/L (ref 22–32)
CREATININE: 0.87 mg/dL (ref 0.61–1.24)
Chloride: 106 mmol/L (ref 98–111)
Glucose, Bld: 100 mg/dL — ABNORMAL HIGH (ref 70–99)
Potassium: 4 mmol/L (ref 3.5–5.1)
SODIUM: 139 mmol/L (ref 135–145)
TOTAL PROTEIN: 7.2 g/dL (ref 6.5–8.1)

## 2017-12-27 NOTE — ED Triage Notes (Addendum)
Pt c/o R sided flank pain radiating into lower abd x9 days with pain radiating into leg at times. Denies urinary issues or NV

## 2017-12-27 NOTE — ED Provider Notes (Signed)
[facility] EMERGENCY DEPARTMENT Provider Note   CSN: [id number] Arrival date & time: 12/27/17  1900     History   Chief Complaint Chief Complaint  Patient presents with  . Flank Pain    HPI Tristan Hall is a 42 y.o. male.  Patient presents to the emergency department with chief complaint of right flank pain.  He states symptoms started suddenly 2 days ago.  He states that the pain radiates down his right leg.  He denies any pain in his low back.  He denies any fever, chills, nausea, or vomiting.  Denies any postprandial symptoms.  He reports history of appendectomy.  He has not taken anything for his symptoms.  Denies any trauma or other known injury.  The history is provided by the patient. No language interpreter was used.    Past Medical History:  Diagnosis Date  . Hypercholesteremia     Patient Active Problem List   Diagnosis Date Noted  . Acute appendicitis 08/13/2015  . Appendicitis, acute 08/13/2015    Past Surgical History:  Procedure Laterality Date  . LAPAROSCOPIC APPENDECTOMY N/A 08/13/2015   Procedure: APPENDECTOMY LAPAROSCOPIC;  Surgeon: Emelia Loron, MD;  Location: [facility] OR;  Service: General;  Laterality: N/A;        Home Medications    Prior to Admission medications   Medication Sig Start Date End Date Taking? Authorizing Provider  HYDROcodone-acetaminophen (NORCO/VICODIN) 5-325 MG tablet Take 1 tablet by mouth every 6 (six) hours as needed for moderate pain. Patient not taking: Reported on 12/27/2017 08/23/15   Charlestine Night, PA-C  ibuprofen (ADVIL,MOTRIN) 800 MG tablet Take 1 tablet (800 mg total) by mouth every 8 (eight) hours as needed. Patient not taking: Reported on 12/27/2017 08/23/15   Charlestine Night, PA-C  oxyCODONE-acetaminophen (PERCOCET/ROXICET) 5-325 MG tablet Take 1-2 tablets by mouth every 4 (four) hours as needed for moderate pain. Patient not taking: Reported on 12/27/2017 08/14/15   Ashok Norris,  NP    Family History No family history on file.  Social History Social History   Tobacco Use  . Smoking status: Never Smoker  . Smokeless tobacco: Never Used  Substance Use Topics  . Alcohol use: Yes    Comment: occ  . Drug use: Never     Allergies   Patient has no known allergies.   Review of Systems Review of Systems  All other systems reviewed and are negative.    Physical Exam Updated Vital Signs BP 116/75 (BP Location: Right Arm)   Pulse (!) 59   Temp 97.7 F (36.5 C) (Oral)   Resp 16   SpO2 99%   Physical Exam  Constitutional: He is oriented to person, place, and time. He appears well-developed and well-nourished. No distress.  HENT:  Head: Normocephalic and atraumatic.  Eyes: Pupils are equal, round, and reactive to light. Conjunctivae and EOM are normal. Right eye exhibits no discharge. Left eye exhibits no discharge. No scleral icterus.  Neck: Normal range of motion. Neck supple. No JVD present. No tracheal deviation present.  Cardiovascular: Normal rate, regular rhythm and normal heart sounds. Exam reveals no gallop and no friction rub.  No murmur heard. Pulmonary/Chest: Effort normal and breath sounds normal. No respiratory distress. He has no wheezes. He has no rales. He exhibits no tenderness.  Abdominal: Soft. He exhibits no distension and no mass. There is no tenderness. There is no rebound and no guarding.  Musculoskeletal: Normal range of motion. He exhibits no edema or tenderness.  No  paraspinal muscles tender to palpation, no bony tenderness, step-offs, or gross abnormality or deformity of spine, patient is able to ambulate, moves all extremities  Bilateral great toe extension intact Bilateral plantar/dorsiflexion intact  Neurological: He is alert and oriented to person, place, and time. He has normal reflexes.  Sensation and strength intact bilaterally Symmetrical reflexes  Skin: Skin is warm and dry. He is not diaphoretic.  Psychiatric: He  has a normal mood and affect. His behavior is normal. Judgment and thought content normal.  Nursing note and vitals reviewed.    ED Treatments / Results  Labs (all labs ordered are listed, but only abnormal results are displayed) Labs Reviewed  COMPREHENSIVE METABOLIC PANEL - Abnormal; Notable for the following components:      Result Value   Glucose, Bld 100 (*)    ALT 45 (*)    All other components within normal limits  URINALYSIS, ROUTINE W REFLEX MICROSCOPIC  CBC WITH DIFFERENTIAL/PLATELET    EKG None  Radiology Ct Renal Stone Study  Result Date: 12/27/2017 CLINICAL DATA:  42 y/o M; right-sided flank pain radiating into the lower abdomen for 9 days. EXAM: CT ABDOMEN AND PELVIS WITHOUT CONTRAST TECHNIQUE: Multidetector CT imaging of the abdomen and pelvis was performed following the standard protocol without IV contrast. COMPARISON:  08/23/2015 CT abdomen and pelvis. FINDINGS: Lower chest: Stable 2-3 mm nodules throughout the lower lobes compatible with benign etiology. Hepatobiliary: Hepatic steatosis. No focal liver abnormality is seen. No gallstones, gallbladder wall thickening, or biliary dilatation. Pancreas: Unremarkable. No pancreatic ductal dilatation or surrounding inflammatory changes. Spleen: Normal in size without focal abnormality. Adrenals/Urinary Tract: Adrenal glands are unremarkable. Kidneys are normal, without renal calculi, focal lesion, or hydronephrosis. Bladder is unremarkable. Stomach/Bowel: Stomach is within normal limits. Appendectomy. No evidence of bowel wall thickening, distention, or inflammatory changes. Fecalization of stool content within the terminal ileum. Vascular/Lymphatic: No significant vascular findings are present. No enlarged abdominal or pelvic lymph nodes. Reproductive: Prostatic calcifications. Other: No abdominal wall hernia or abnormality. No abdominopelvic ascites. Musculoskeletal: No acute or significant osseous findings. IMPRESSION: 1. No  acute process identified. No urinary stone disease or hydronephrosis. 2. Appendectomy. Fecalization of stool contents within terminal ileum may reflect dysmotility. 3. Hepatic steatosis. Electronically Signed   By: Mitzi HansenLance  Furusawa-Stratton M.D.   On: 12/27/2017 23:52    Procedures Procedures (including critical care time)  Medications Ordered in ED Medications - No data to display   Initial Impression / Assessment and Plan / ED Course  I have reviewed the triage vital signs and the nursing notes.  Pertinent labs & imaging results that were available during my care of the patient were reviewed by me and considered in my medical decision making (see chart for details).    Patient with right flank pain.  History of appendectomy.  Vital signs are stable.  He is afebrile.  No leukocytosis.  No hemoglobin or red blood cells seen in urine.  CT renal study is negative.  LFTs are normal.  No upper abdominal tenderness on exam.  He does wear a tight belt over the affected area, question meralgia paresthetica versus lumbar radiculopathy.  Recommend PCP follow-up.  Will try prednisone.  Final Clinical Impressions(s) / ED Diagnoses   Final diagnoses:  Lumbar radiculopathy    ED Discharge Orders    None       Roxy HorsemanBrowning, Jadis Mika, PA-C 12/28/17 0158    Pricilla LovelessGoldston, Scott, MD 12/28/17 1723

## 2017-12-28 MED ORDER — PREDNISONE 20 MG PO TABS: 40.0000 mg | ORAL_TABLET | Freq: Every day | ORAL | 0 refills | Status: DC

## 2018-05-08 ENCOUNTER — Ambulatory Visit: Payer: Self-pay | Admitting: Internal Medicine

## 2018-07-04 DIAGNOSIS — Z8616 Personal history of COVID-19: Secondary | ICD-10-CM

## 2018-07-04 HISTORY — DX: Personal history of COVID-19: Z86.16

## 2018-12-08 ENCOUNTER — Emergency Department (HOSPITAL_COMMUNITY): Payer: Self-pay

## 2018-12-08 ENCOUNTER — Emergency Department (HOSPITAL_COMMUNITY)
Admission: EM | Admit: 2018-12-08 | Discharge: 2018-12-08 | Disposition: A | Discharge status: Home / Self Care | Payer: Self-pay | Attending: Emergency Medicine | Admitting: Emergency Medicine

## 2018-12-08 ENCOUNTER — Encounter (HOSPITAL_COMMUNITY): Payer: Self-pay | Admitting: Emergency Medicine

## 2018-12-08 ENCOUNTER — Other Ambulatory Visit: Payer: Self-pay

## 2018-12-08 DIAGNOSIS — R0789 Other chest pain: Secondary | ICD-10-CM | POA: Insufficient documentation

## 2018-12-08 LAB — BASIC METABOLIC PANEL
Anion gap: 11 (ref 5–15)
BUN: 20 mg/dL (ref 6–20)
CO2: 23 mmol/L (ref 22–32)
Calcium: 9.3 mg/dL (ref 8.9–10.3)
Chloride: 105 mmol/L (ref 98–111)
Creatinine, Ser: 1.01 mg/dL (ref 0.61–1.24)
GFR calc Af Amer: >60 mL/min (ref >60)
GFR calc non Af Amer: >60 mL/min (ref >60)
Glucose, Bld: 88 mg/dL (ref 70–99)
Potassium: 4.3 mmol/L (ref 3.5–5.1)
Sodium: 139 mmol/L (ref 135–145)

## 2018-12-08 LAB — TROPONIN I
Troponin I: <0.03 ng/mL (ref <0.03)
Troponin I: <0.03 ng/mL (ref <0.03)

## 2018-12-08 LAB — CBC
HCT: 47.5 % (ref 39.0–52.0)
Hemoglobin: 15.5 g/dL (ref 13.0–17.0)
MCH: 30 pg (ref 26.0–34.0)
MCHC: 32.6 g/dL (ref 30.0–36.0)
MCV: 92.1 fL (ref 80.0–100.0)
Platelets: 221 10*3/uL (ref 150–400)
RBC: 5.16 MIL/uL (ref 4.22–5.81)
RDW: 13.5 % (ref 11.5–15.5)
WBC: 8.8 10*3/uL (ref 4.0–10.5)
nRBC: 0 % (ref 0.0–0.2)

## 2018-12-08 LAB — MAGNESIUM: Magnesium: 2.3 mg/dL (ref 1.7–2.4)

## 2018-12-08 MED ORDER — IBUPROFEN 800 MG PO TABS
800.0000 mg | ORAL_TABLET | Freq: Once | ORAL | Status: AC
  Administered 2018-12-08: 800 mg via ORAL
  Filled 2018-12-08: qty 1

## 2018-12-08 NOTE — ED Triage Notes (Signed)
Pt c/o left sided chest pain that radiates to his left arm x 2 days. Denies shortness of breath/cough/fever.

## 2018-12-08 NOTE — Discharge Instructions (Addendum)
Apply ice as needed.  Take ibuprofen or naproxen for pain.  You may add acetaminophen for additional pain relief.  Return if symptoms are getting worse.

## 2018-12-08 NOTE — ED Provider Notes (Signed)
[facility] Provider Note   CSN: [id number] Arrival date & time: 12/08/18  0020    History   Chief Complaint Chief Complaint  Patient presents with  . Chest Pain    HPI Tristan Hall is a 43 y.o. male.   The history is provided by the patient.  He has history of hyperlipidemia and comes in complaining of sharp left-sided chest pain which is been present intermittently over the last 3 days.  Pain is sharp and worse with breathing and worse with movement.  There is associated dyspnea and diaphoresis but no nausea.  He was not aware of any precipitating factors.  Pain, when present, will last about 2 minutes.  He is a non-smoker.  He denies family history of premature coronary atherosclerosis.  Past Medical History:  Diagnosis Date  . Hypercholesteremia     Patient Active Problem List   Diagnosis Date Noted  . Acute appendicitis 08/13/2015  . Appendicitis, acute 08/13/2015    Past Surgical History:  Procedure Laterality Date  . LAPAROSCOPIC APPENDECTOMY N/A 08/13/2015   Procedure: APPENDECTOMY LAPAROSCOPIC;  Surgeon: Emelia LoronMatthew Wakefield, MD;  Location: [facility] OR;  Service: General;  Laterality: N/A;        Home Medications    Prior to Admission medications   Medication Sig Start Date End Date Taking? Authorizing Provider  HYDROcodone-acetaminophen (NORCO/VICODIN) 5-325 MG tablet Take 1 tablet by mouth every 6 (six) hours as needed for moderate pain. Patient not taking: Reported on 12/27/2017 08/23/15   Charlestine NightLawyer, Christopher, PA-C  ibuprofen (ADVIL,MOTRIN) 800 MG tablet Take 1 tablet (800 mg total) by mouth every 8 (eight) hours as needed. Patient not taking: Reported on 12/27/2017 08/23/15   Charlestine NightLawyer, Christopher, PA-C  oxyCODONE-acetaminophen (PERCOCET/ROXICET) 5-325 MG tablet Take 1-2 tablets by mouth every 4 (four) hours as needed for moderate pain. Patient not taking: Reported on 12/27/2017 08/14/15   Ashok Norrisiebock, Emina, NP  predniSONE  (DELTASONE) 20 MG tablet Take 2 tablets (40 mg total) by mouth daily. 12/28/17   Roxy HorsemanBrowning, Robert, PA-C    Family History No family history on file.  Social History Social History   Tobacco Use  . Smoking status: Never Smoker  . Smokeless tobacco: Never Used  Substance Use Topics  . Alcohol use: Yes    Comment: occ  . Drug use: Never     Allergies   Patient has no known allergies.   Review of Systems Review of Systems  All other systems reviewed and are negative.    Physical Exam Updated Vital Signs BP 123/78 (BP Location: Right Arm)   Pulse (!) 56   Temp 98.5 F (36.9 C) (Oral)   Resp 18   SpO2 96%   Physical Exam Vitals signs and nursing note reviewed.    43 year old male, resting comfortably and in no acute distress. Vital signs are normal. Oxygen saturation is 96%, which is normal. Head is normocephalic and atraumatic. PERRLA, EOMI. Oropharynx is clear. Neck is nontender and supple without adenopathy or JVD. Back is nontender and there is no CVA tenderness. Lungs are clear without rales, wheezes, or rhonchi. Chest is mildly tender on the left side, and this does reproduce his pain. Heart has regular rate and rhythm without murmur. Abdomen is soft, flat, nontender without masses or hepatosplenomegaly and peristalsis is normoactive. Extremities have no cyanosis or edema, full range of motion is present. Skin is warm and dry without rash. Neurologic: Mental status is normal, cranial nerves are intact, there are no  motor or sensory deficits.  ED Treatments / Results  Labs (all labs ordered are listed, but only abnormal results are displayed) Labs Reviewed  BASIC METABOLIC PANEL  CBC  TROPONIN I  MAGNESIUM  TROPONIN I    EKG EKG Interpretation  Date/Time:  Saturday December 08 2018 01:07:58 EDT Ventricular Rate:  65 PR Interval:    QRS Duration: 105 QT Interval:  401 QTC Calculation: 417 R Axis:   87 Text Interpretation:  Sinus rhythm Abnormal  R-wave progression, early transition When compared with ECG of 08/12/2015, No significant change was found Confirmed by Delora Fuel ([id number]) on 12/08/2018 1:09:38 AM   Radiology Dg Chest Portable 1 View  Result Date: 12/08/2018 CLINICAL DATA:  Left-sided chest pain. EXAM: PORTABLE CHEST 1 VIEW COMPARISON:  08/12/2015 FINDINGS: The heart size is enlarged but stable from prior study. There is no pneumothorax. No large pleural effusion. No significant infiltrate. No acute osseous abnormality. IMPRESSION: No active disease. Electronically Signed   By: Constance Holster M.D.   On: 12/08/2018 01:53    Procedures Procedures   Medications Ordered in ED Medications  ibuprofen (ADVIL) tablet 800 mg (800 mg Oral Given 12/08/18 0230)     Initial Impression / Assessment and Plan / ED Course  I have reviewed the triage vital signs and the nursing notes.  Pertinent labs & imaging results that were available during my care of the patient were reviewed by me and considered in my medical decision making (see chart for details).  Chest pain of uncertain cause.  Pain with movement and palpation's suggest musculoskeletal origin.  Only risk factor for coronary disease is hyperlipidemia.  ECG shows no ST or T changes.  Chest x-ray is normal.  Will check routine labs including troponin, and will give a trial of ibuprofen.  Heart score is 1, which puts him at very low risk of major adverse cardiac events in the next 6 weeks.  He had good relief of pain with ibuprofen.  Labs are unremarkable.  Troponin is normal x2.  He is felt to be safe for discharge.  Advised to use over-the-counter NSAIDs, return precautions discussed.  Final Clinical Impressions(s) / ED Diagnoses   Final diagnoses:  Musculoskeletal chest pain    ED Discharge Orders    None       Delora Fuel, MD 96/78/93 [phone]

## 2018-12-08 NOTE — ED Notes (Signed)
Patient verbalizes understanding of discharge instructions. Opportunity for questioning and answers were provided. Armband removed by staff, pt discharged from ED ambulatory.   

## 2019-02-11 ENCOUNTER — Other Ambulatory Visit: Payer: Self-pay

## 2019-02-11 DIAGNOSIS — Z20822 Contact with and (suspected) exposure to covid-19: Secondary | ICD-10-CM

## 2019-02-12 LAB — NOVEL CORONAVIRUS, NAA: SARS-CoV-2, NAA: NOT DETECTED

## 2019-02-13 ENCOUNTER — Telehealth: Payer: Self-pay | Admitting: General Practice

## 2019-02-13 NOTE — Telephone Encounter (Signed)
Negative COVID results given. Patient results "NOT Detected." Caller expressed understanding. ° °

## 2019-04-19 ENCOUNTER — Emergency Department (HOSPITAL_COMMUNITY): Payer: Self-pay

## 2019-04-19 ENCOUNTER — Emergency Department (HOSPITAL_COMMUNITY)
Admission: EM | Admit: 2019-04-19 | Discharge: 2019-04-20 | Disposition: A | Discharge status: Home / Self Care | Payer: Self-pay | Attending: Emergency Medicine | Admitting: Emergency Medicine

## 2019-04-19 ENCOUNTER — Encounter (HOSPITAL_COMMUNITY): Payer: Self-pay | Admitting: Emergency Medicine

## 2019-04-19 ENCOUNTER — Other Ambulatory Visit: Payer: Self-pay

## 2019-04-19 DIAGNOSIS — Y9389 Activity, other specified: Secondary | ICD-10-CM | POA: Insufficient documentation

## 2019-04-19 DIAGNOSIS — S61317A Laceration without foreign body of left little finger with damage to nail, initial encounter: Secondary | ICD-10-CM | POA: Insufficient documentation

## 2019-04-19 DIAGNOSIS — W231XXA Caught, crushed, jammed, or pinched between stationary objects, initial encounter: Secondary | ICD-10-CM | POA: Insufficient documentation

## 2019-04-19 DIAGNOSIS — S62639B Displaced fracture of distal phalanx of unspecified finger, initial encounter for open fracture: Secondary | ICD-10-CM

## 2019-04-19 DIAGNOSIS — Y929 Unspecified place or not applicable: Secondary | ICD-10-CM | POA: Insufficient documentation

## 2019-04-19 DIAGNOSIS — S61319A Laceration without foreign body of unspecified finger with damage to nail, initial encounter: Secondary | ICD-10-CM

## 2019-04-19 DIAGNOSIS — Z23 Encounter for immunization: Secondary | ICD-10-CM | POA: Insufficient documentation

## 2019-04-19 DIAGNOSIS — S6710XA Crushing injury of unspecified finger(s), initial encounter: Secondary | ICD-10-CM

## 2019-04-19 DIAGNOSIS — Y999 Unspecified external cause status: Secondary | ICD-10-CM | POA: Insufficient documentation

## 2019-04-19 MED ORDER — LIDOCAINE HCL (PF) 1 % IJ SOLN
5.0000 mL | Freq: Once | INTRAMUSCULAR | Status: AC
  Administered 2019-04-19: 5 mL
  Filled 2019-04-19: qty 5

## 2019-04-19 MED ORDER — CEFAZOLIN SODIUM-DEXTROSE 2-4 GM/100ML-% IV SOLN
2.0000 g | Freq: Once | INTRAVENOUS | Status: AC
  Administered 2019-04-19: 22:00:00 2 g via INTRAVENOUS
  Filled 2019-04-19: qty 100

## 2019-04-19 MED ORDER — TETANUS-DIPHTH-ACELL PERTUSSIS 5-2.5-18.5 LF-MCG/0.5 IM SUSP
0.5000 mL | Freq: Once | INTRAMUSCULAR | Status: AC
  Administered 2019-04-19: 0.5 mL via INTRAMUSCULAR
  Filled 2019-04-19: qty 0.5

## 2019-04-19 MED ORDER — FENTANYL CITRATE (PF) 100 MCG/2ML IJ SOLN
50.0000 ug | Freq: Once | INTRAMUSCULAR | Status: AC
  Administered 2019-04-19: 50 ug via INTRAVENOUS
  Filled 2019-04-19: qty 2

## 2019-04-19 NOTE — Discharge Instructions (Addendum)
Please follow-up in clinic with our hand surgeons. Your XR showed a contaminated wound with a fracture of the tip of your finger. Your received IV antibiotics. We have prescribed you antibiotics and pain medications.

## 2019-04-19 NOTE — ED Notes (Signed)
Provider numbed finger, RN placed hand to soak.

## 2019-04-19 NOTE — ED Provider Notes (Signed)
[facility] EMERGENCY DEPARTMENT Provider Note   CSN: [id number] Arrival date & time: 04/19/19  1828     History   Chief Complaint Chief Complaint  Patient presents with  . Finger Injury    HPI Tristan Hall is a 43 y.o. male.     HPI  The patient is a 43 year old male with HLD who presents with a left 5th digit crush injury. Around 1 hour PTA he sustained a crush injury to his pinky finger. He was working on his car, specifically his transmission, when it slipped and fell onto his hand. He sustained a U shaped laceration around and involving the nail bed, causing the finger nails to barely hand on to the finger via a small bit of tissue. He states that he is not UTD on his tetanus. He complains of 10/10 pain in the digit. He arrived neurovascularly intact.  Past Medical History:  Diagnosis Date  . Hypercholesteremia     Patient Active Problem List   Diagnosis Date Noted  . Acute appendicitis 08/13/2015  . Appendicitis, acute 08/13/2015    Past Surgical History:  Procedure Laterality Date  . LAPAROSCOPIC APPENDECTOMY N/A 08/13/2015   Procedure: APPENDECTOMY LAPAROSCOPIC;  Surgeon: Emelia Loron, MD;  Location: [facility] OR;  Service: General;  Laterality: N/A;        Home Medications    Prior to Admission medications   Medication Sig Start Date End Date Taking? Authorizing Provider  amoxicillin-clavulanate (AUGMENTIN) 500-125 MG tablet Take 1 tablet (500 mg total) by mouth every 8 (eight) hours for 7 days. 04/20/19 04/27/19  Ernie Avena, MD  HYDROcodone-acetaminophen (NORCO/VICODIN) 5-325 MG tablet Take 2 tablets by mouth every 4 (four) hours as needed for up to 5 days. 04/19/19 04/24/19  Ernie Avena, MD    Family History History reviewed. No pertinent family history.  Social History Social History   Tobacco Use  . Smoking status: Never Smoker  . Smokeless tobacco: Never Used  Substance Use Topics  . Alcohol use: Yes   Comment: occ  . Drug use: Never     Allergies   Patient has no known allergies.   Review of Systems Review of Systems  Constitutional: Negative for chills and fever.  Respiratory: Negative for cough and shortness of breath.   Cardiovascular: Negative for chest pain.  Gastrointestinal: Negative for abdominal pain, nausea and vomiting.  Genitourinary: Negative for dysuria.  Skin: Positive for wound.  Neurological: Negative for headaches.  All other systems reviewed and are negative.    Physical Exam Updated Vital Signs BP 127/82   Pulse 60   Temp 98.7 F (37.1 C) (Oral)   Resp 18   SpO2 96%   Physical Exam Vitals signs and nursing note reviewed.  Constitutional:      Appearance: He is well-developed.  HENT:     Head: Normocephalic and atraumatic.  Eyes:     Conjunctiva/sclera: Conjunctivae normal.  Neck:     Musculoskeletal: Neck supple.  Cardiovascular:     Rate and Rhythm: Normal rate and regular rhythm.  Pulmonary:     Effort: Pulmonary effort is normal. No respiratory distress.     Breath sounds: Normal breath sounds.  Abdominal:     Palpations: Abdomen is soft.     Tenderness: There is no abdominal tenderness.  Musculoskeletal:     Comments: Left 5th digit neurovascularly intact, covered in soot and grime/oil, with an avulsion injury and almost complete circumferential laceration surrounding and involving the nail bed of  the finger. The fingernail is attached to the finger by a small amount of tissue. Intact flexion and extension of the digit.   Skin:    General: Skin is warm and dry.  Neurological:     Mental Status: He is alert.          ED Treatments / Results  Labs (all labs ordered are listed, but only abnormal results are displayed) Labs Reviewed - No data to display  EKG None  Radiology Dg Hand Complete Left  Result Date: 04/19/2019 CLINICAL DATA:  Crush injury to fifth digit. EXAM: LEFT HAND - COMPLETE 3+ VIEW COMPARISON:  None.  FINDINGS: There is mild deformity of the distal fifth phalanx with foci of high attenuation in the surrounding soft tissues. There is deformity of the adjacent soft tissues. IMPRESSION: 1. There is a comminuted fracture of the distal tuft of the fifth phalanx. High attenuation foci in the adjacent soft tissues are identified. Some of this high attenuation foci are consistent with fracture fragments. However, the number of high attenuation foci is more than expected based on the appearance of the distal phalanx and superimposed foreign bodies are not excluded. Electronically Signed   By: Gerome Samavid  Williams III M.D   On: 04/19/2019 20:48    Procedures .Marland Kitchen.Laceration Repair  Date/Time: 04/20/2019 11:57 AM Performed by: Ernie AvenaLawsing, Geremy Rister, MD Authorized by: Heide Scalesegeler, Christopher J, MD   Consent:    Consent obtained:  Verbal and emergent situation   Consent given by:  Patient   Risks discussed:  Infection, pain, retained foreign body, vascular damage, poor cosmetic result, need for additional repair, nerve damage and poor wound healing   Alternatives discussed:  No treatment and delayed treatment Anesthesia (see MAR for exact dosages):    Anesthesia method:  Local infiltration and nerve block   Local anesthetic:  Lidocaine 2% w/o epi   Block location:  L 5th digit   Block needle gauge:  25 G   Block anesthetic:  Lidocaine 2% w/o epi   Block technique:  Digital block, bilaterally at the webs of the digit   Block injection procedure:  Anatomic landmarks identified, introduced needle, incremental injection and negative aspiration for blood   Block outcome:  Anesthesia achieved Laceration details:    Location:  Finger   Finger location:  L small finger   Length (cm):  4   Depth (mm):  6 Repair type:    Repair type:  Complex Pre-procedure details:    Preparation:  Imaging obtained to evaluate for foreign bodies and patient was prepped and draped in usual sterile fashion Exploration:    Limited defect  created (wound extended): no     Hemostasis achieved with:  Tourniquet   Wound exploration: wound explored through full range of motion and entire depth of wound probed and visualized     Wound extent: underlying fracture     Contaminated: yes   Treatment:    Area cleansed with:  Betadine, saline and soap and water   Amount of cleaning:  Extensive   Irrigation solution:  Sterile water   Irrigation volume:  100cc   Irrigation method:  Syringe   Visualized foreign bodies/material removed: yes     Debridement:  Minimal   Undermining:  Minimal Skin repair:    Repair method:  Sutures   Suture size:  5-0   Suture material:  Prolene (Vicryl also used)   Suture technique:  Simple interrupted   Number of sutures: approximately 8-10. Approximation:    Approximation:  Close Post-procedure details:    Dressing:  Adhesive bandage and splint for protection   Patient tolerance of procedure:  Tolerated well, no immediate complications   (including critical care time)  Medications Ordered in ED Medications  Tdap (BOOSTRIX) injection 0.5 mL (0.5 mLs Intramuscular Given 04/19/19 2103)  fentaNYL (SUBLIMAZE) injection 50 mcg (50 mcg Intravenous Given 04/19/19 2059)  ceFAZolin (ANCEF) IVPB 2g/100 mL premix (0 g Intravenous Stopped 04/19/19 2205)  lidocaine (PF) (XYLOCAINE) 1 % injection 5 mL (5 mLs Infiltration Given 04/19/19 2136)     Initial Impression / Assessment and Plan / ED Course  I have reviewed the triage vital signs and the nursing notes.  Pertinent labs & imaging results that were available during my care of the patient were reviewed by me and considered in my medical decision making (see chart for details).        The patient is a 43 year old right handed male who sustained a crush injury to his left 5th digit. He arrived neurovascularly intact with a grossly contaminated wound involving the nailbed as described above. XR of the left hand was ordered as was tetanus and 67mcg of  Fentanyl for pain. [facility]hand was consulted for further management given the gross contamination and significant involvement of the fingernail bed. The XR revealed a comminuted fracture of the distal tuft of the fifth phalanx with fracture fragments and possible foreign bodies.  Ortho/hand recommended washout and suturing in the ED with follow-up in clinic. IV Ancef was administered and Fentanyl was provided for pain control.  The 5th finger on the left was anesthestized with a digital block of 2% lidocaine WO epi. The hand was soaked in betadine and warm soapy water and the finger was scrubbed with a scrub brush and betadine given the extensive grime. The wound was then irrigated thoroughly and dirt and grime was removed. The nail was then removed to expose the nail bed with had a laceration and was partially macerated on the lateral aspect. A ring tourniquet was placed. 5-0 Vicryl was then utilized (3-4 sutures) to achieve hemostasis and approximate the tissue of the nail bed. Macerated tissue was repaired and approximated around the nail bed with Prolene sutures. The nail was cleaned and then sutured back into place with two 5-0 Vicryl sutures. A splint was applied and the finger was wrapped in gauze. A referral was placed to orthopedics for clinic follow-up. The patient was provided a prescription for Augmentin and Norco.   Final Clinical Impressions(s) / ED Diagnoses   Final diagnoses:  Open fracture of tuft of distal phalanx of finger  Crushing injury of finger, initial encounter  Laceration of nail bed of finger, initial encounter    ED Discharge Orders         Ordered    amoxicillin-clavulanate (AUGMENTIN) 500-125 MG tablet  Every 8 hours     04/20/19 0004    Ambulatory referral to Hand Surgery     04/19/19 2351    HYDROcodone-acetaminophen (NORCO/VICODIN) 5-325 MG tablet  Every 4 hours PRN     04/20/19 0004           Regan Lemming, MD 04/20/19 Coronado, Gwenyth Allegra, MD 04/20/19 [phone]

## 2019-04-19 NOTE — ED Notes (Signed)
Patient transported to X-ray 

## 2019-04-19 NOTE — ED Triage Notes (Addendum)
While fixing a car, patient got pinky finger of left hand caught on transmission. Finger nail hanging off and distal portion of finger mangled. Bleeding controlled.

## 2019-04-20 MED ORDER — HYDROCODONE-ACETAMINOPHEN 5-325 MG PO TABS: 2.0000 | ORAL_TABLET | ORAL | 0 refills | Status: AC | PRN

## 2019-04-20 MED ORDER — AMOXICILLIN-POT CLAVULANATE 500-125 MG PO TABS: 1.0000 | ORAL_TABLET | Freq: Three times a day (TID) | ORAL | 0 refills | Status: AC

## 2019-04-28 ENCOUNTER — Ambulatory Visit (HOSPITAL_COMMUNITY)
Admission: EM | Admit: 2019-04-28 | Discharge: 2019-04-28 | Disposition: A | Discharge status: Home / Self Care | Payer: Self-pay | Attending: Family Medicine | Admitting: Family Medicine

## 2019-04-28 ENCOUNTER — Encounter (HOSPITAL_COMMUNITY): Payer: Self-pay

## 2019-04-28 DIAGNOSIS — Z4802 Encounter for removal of sutures: Secondary | ICD-10-CM

## 2019-04-28 NOTE — ED Triage Notes (Signed)
Pt is here to have suture removed from his pinky. Approximately 8-10 suture are being removed.

## 2019-05-03 ENCOUNTER — Other Ambulatory Visit: Payer: Self-pay

## 2019-05-03 ENCOUNTER — Encounter: Payer: Self-pay | Admitting: Physician Assistant

## 2019-05-03 ENCOUNTER — Ambulatory Visit: Payer: Self-pay | Admitting: Physician Assistant

## 2019-05-03 DIAGNOSIS — Z113 Encounter for screening for infections with a predominantly sexual mode of transmission: Secondary | ICD-10-CM

## 2019-05-03 LAB — GRAM STAIN

## 2019-05-03 NOTE — Progress Notes (Signed)
    STI clinic/screening visit  Subjective:  Nachmen Mansel is a 43 y.o. male being seen today for an STI screening visit. The patient reports they do not have symptoms.  Patient has the following medical conditions:   Patient Active Problem List   Diagnosis Date Noted  . Acute appendicitis 08/13/2015  . Appendicitis, acute 08/13/2015     Chief Complaint  Patient presents with  . SEXUALLY TRANSMITTED DISEASE    HPI  Patient reports that he is here for a screening.  Denies any current symptoms.  States taking an antibiotic and pain medicine given to him at the ER due to an injury to his finger.  See flowsheet for further details and programmatic requirements.    The following portions of the patient's history were reviewed and updated as appropriate: allergies, current medications, past medical history, past social history, past surgical history and problem list.  Objective:  There were no vitals filed for this visit.  Physical Exam Constitutional:      General: He is not in acute distress.    Appearance: Normal appearance. He is obese.  HENT:     Head: Normocephalic and atraumatic.     Mouth/Throat:     Mouth: Mucous membranes are moist.     Pharynx: Oropharynx is clear. No oropharyngeal exudate or posterior oropharyngeal erythema.  Eyes:     Conjunctiva/sclera: Conjunctivae normal.  Neck:     Musculoskeletal: Neck supple.  Pulmonary:     Effort: Pulmonary effort is normal.  Abdominal:     Palpations: Abdomen is soft. There is no mass.     Tenderness: There is no abdominal tenderness. There is no guarding or rebound.  Genitourinary:    Penis: Normal.      Scrotum/Testes: Normal.     Comments: Pubic area without nits, lice, edema, erythema, lesions and inguinal adenopathy. Penis uncircumcised and without discharge at meatus. Lymphadenopathy:     Cervical: No cervical adenopathy.  Skin:    General: Skin is warm and dry.     Findings: No bruising,  erythema, lesion or rash.  Neurological:     Mental Status: He is alert and oriented to person, place, and time.  Psychiatric:        Mood and Affect: Mood normal.        Behavior: Behavior normal.        Thought Content: Thought content normal.        Judgment: Judgment normal.       Assessment and Plan:  Arnaldo Heffron is a 43 y.o. male presenting to the [facility] for STI screening  1. Screening for STD (sexually transmitted disease) Patient into clinic without symptoms.  Counseled that due to antibiotic treatment, some of the testing may not be as accurate as it would be if he was not on the antibiotic.  Counseled that if he has concerns, he can return to clinic and retest. Reviewed Gram stain results with patient and no treatment indicated today. Rec condoms with all sex. Await test results.  Counseled that RN will call if needs to RTC for any treatment once results are back. - Gram stain - Gonococcus culture - HIV [facility] - Syphilis Serology, [facility]     No follow-ups on file.  No future appointments.  Jerene Dilling, PA

## 2019-05-08 LAB — GONOCOCCUS CULTURE

## 2019-05-17 ENCOUNTER — Other Ambulatory Visit: Payer: Self-pay

## 2019-05-17 ENCOUNTER — Ambulatory Visit: Payer: Self-pay

## 2019-05-17 DIAGNOSIS — Z708 Other sex counseling: Secondary | ICD-10-CM

## 2019-10-02 ENCOUNTER — Emergency Department (HOSPITAL_COMMUNITY)
Admission: EM | Admit: 2019-10-02 | Discharge: 2019-10-03 | Disposition: A | Discharge status: Home / Self Care | Payer: Self-pay | Attending: Emergency Medicine | Admitting: Emergency Medicine

## 2019-10-02 ENCOUNTER — Other Ambulatory Visit: Payer: Self-pay

## 2019-10-02 ENCOUNTER — Encounter (HOSPITAL_COMMUNITY): Payer: Self-pay

## 2019-10-02 DIAGNOSIS — X58XXXA Exposure to other specified factors, initial encounter: Secondary | ICD-10-CM | POA: Insufficient documentation

## 2019-10-02 DIAGNOSIS — Y929 Unspecified place or not applicable: Secondary | ICD-10-CM | POA: Insufficient documentation

## 2019-10-02 DIAGNOSIS — L089 Local infection of the skin and subcutaneous tissue, unspecified: Secondary | ICD-10-CM | POA: Insufficient documentation

## 2019-10-02 DIAGNOSIS — T148XXA Other injury of unspecified body region, initial encounter: Secondary | ICD-10-CM

## 2019-10-02 DIAGNOSIS — S21109A Unspecified open wound of unspecified front wall of thorax without penetration into thoracic cavity, initial encounter: Secondary | ICD-10-CM | POA: Insufficient documentation

## 2019-10-02 DIAGNOSIS — Y998 Other external cause status: Secondary | ICD-10-CM | POA: Insufficient documentation

## 2019-10-02 DIAGNOSIS — L539 Erythematous condition, unspecified: Secondary | ICD-10-CM | POA: Insufficient documentation

## 2019-10-02 DIAGNOSIS — Y9389 Activity, other specified: Secondary | ICD-10-CM | POA: Insufficient documentation

## 2019-10-02 NOTE — ED Triage Notes (Signed)
Pt arrives POV for eval of R sided chest cellulitis from a pimple that his GF tried to pop. Pt reports he first noted it Tuesday, GF tried to pop it today, appears infected. Denies known injury or bite. Erythematous around site, minimal drainage, TTP

## 2019-10-03 MED ORDER — DOXYCYCLINE HYCLATE 100 MG PO TABS
100.0000 mg | ORAL_TABLET | Freq: Once | ORAL | Status: AC
  Administered 2019-10-03: 100 mg via ORAL
  Filled 2019-10-03: qty 1

## 2019-10-03 MED ORDER — DOXYCYCLINE HYCLATE 100 MG PO CAPS: 100.0000 mg | ORAL_CAPSULE | Freq: Two times a day (BID) | ORAL | 0 refills | Status: DC

## 2019-10-03 NOTE — ED Provider Notes (Signed)
[facility] EMERGENCY DEPARTMENT Provider Note   CSN: [id number] Arrival date & time: 10/02/19  2244     History Chief Complaint  Patient presents with  . Cellulitis    Ferdinando Lodge is a 44 y.o. male.  Patient presents to the emergency department with chief complaint of wound check.  He states that he noticed a small pimple on his right chest wall on Tuesday.  States that he had his girlfriend pop it.  It did drain some pus.  States that today there has been some redness surrounding the location.  He states that it is itchy and painful.  He denies any fevers.  Denies any further treatments prior to arrival.  Denies any antibiotic allergies.  He states he may been bitten by something, but did not see anything bite him.  The history is provided by the patient. No language interpreter was used.       Past Medical History:  Diagnosis Date  . Hypercholesteremia     Patient Active Problem List   Diagnosis Date Noted  . Acute appendicitis 08/13/2015  . Appendicitis, acute 08/13/2015    Past Surgical History:  Procedure Laterality Date  . LAPAROSCOPIC APPENDECTOMY N/A 08/13/2015   Procedure: APPENDECTOMY LAPAROSCOPIC;  Surgeon: Emelia Loron, MD;  Location: [facility] OR;  Service: General;  Laterality: N/A;       History reviewed. No pertinent family history.  Social History   Tobacco Use  . Smoking status: Never Smoker  . Smokeless tobacco: Never Used  Substance Use Topics  . Alcohol use: Yes    Comment: occ  . Drug use: Never    Home Medications Prior to Admission medications   Medication Sig Start Date End Date Taking? Authorizing Provider  doxycycline (VIBRAMYCIN) 100 MG capsule Take 1 capsule (100 mg total) by mouth 2 (two) times daily. 10/03/19   Roxy Horseman, PA-C    Allergies    Patient has no known allergies.  Review of Systems   Review of Systems  Constitutional: Negative for chills and fever.  Skin: Positive for color  change and wound.    Physical Exam Updated Vital Signs BP 131/82 (BP Location: Right Arm)   Pulse 71   Temp 98.5 F (36.9 C) (Oral)   Resp 16   Ht 5\' 2"  (1.575 m)   Wt 90.7 kg   SpO2 94%   BMI 36.58 kg/m   Physical Exam Vitals and nursing note reviewed.  Constitutional:      General: He is not in acute distress.    Appearance: He is well-developed. He is not ill-appearing.  HENT:     Head: Normocephalic and atraumatic.  Eyes:     Conjunctiva/sclera: Conjunctivae normal.  Cardiovascular:     Rate and Rhythm: Normal rate.  Pulmonary:     Effort: Pulmonary effort is normal. No respiratory distress.  Abdominal:     General: There is no distension.  Musculoskeletal:     Cervical back: Neck supple.     Comments: Moves all extremities  Skin:    General: Skin is warm and dry.     Comments: Small area of erythema surrounding a small wound on the right chest wall, no discharge, no fluctuance, no signs of abscess  Neurological:     Mental Status: He is alert and oriented to person, place, and time.  Psychiatric:        Mood and Affect: Mood normal.        Behavior: Behavior normal.  ED Results / Procedures / Treatments   Labs (all labs ordered are listed, but only abnormal results are displayed) Labs Reviewed - No data to display  EKG None  Radiology No results found.  Procedures Procedures (including critical care time)  Medications Ordered in ED Medications  doxycycline (VIBRA-TABS) tablet 100 mg (has no administration in time range)    ED Course  I have reviewed the triage vital signs and the nursing notes.  Pertinent labs & imaging results that were available during my care of the patient were reviewed by me and considered in my medical decision making (see chart for details).    MDM Rules/Calculators/A&P                      Patient with small wound infection.  There does not appear to be any drainable abscess.  Will cover for the local cellulitic  changes with doxycycline.  Recommend PCP follow-up. Final Clinical Impression(s) / ED Diagnoses Final diagnoses:  Wound infection    Rx / DC Orders ED Discharge Orders         Ordered    doxycycline (VIBRAMYCIN) 100 MG capsule  2 times daily     10/03/19 0034           Montine Circle, PA-C 10/03/19 0037    Mesner, Corene Cornea, MD 10/03/19 [phone]

## 2021-05-27 ENCOUNTER — Emergency Department (HOSPITAL_COMMUNITY): Payer: Self-pay | Admitting: Anesthesiology

## 2021-05-27 ENCOUNTER — Encounter (HOSPITAL_COMMUNITY): Payer: Self-pay

## 2021-05-27 ENCOUNTER — Emergency Department (HOSPITAL_COMMUNITY): Payer: Self-pay

## 2021-05-27 ENCOUNTER — Ambulatory Visit (HOSPITAL_COMMUNITY)
Admission: EM | Admit: 2021-05-27 | Discharge: 2021-05-27 | Disposition: A | Discharge status: Home / Self Care | Payer: Self-pay | Attending: Emergency Medicine | Admitting: Emergency Medicine

## 2021-05-27 ENCOUNTER — Encounter (HOSPITAL_COMMUNITY)
Admission: EM | Disposition: A | Discharge status: Home / Self Care | Payer: Self-pay | Source: Home / Self Care | Attending: Emergency Medicine

## 2021-05-27 DIAGNOSIS — N12 Tubulo-interstitial nephritis, not specified as acute or chronic: Secondary | ICD-10-CM

## 2021-05-27 DIAGNOSIS — N3000 Acute cystitis without hematuria: Secondary | ICD-10-CM | POA: Insufficient documentation

## 2021-05-27 DIAGNOSIS — E1165 Type 2 diabetes mellitus with hyperglycemia: Secondary | ICD-10-CM | POA: Insufficient documentation

## 2021-05-27 DIAGNOSIS — N400 Enlarged prostate without lower urinary tract symptoms: Secondary | ICD-10-CM | POA: Insufficient documentation

## 2021-05-27 DIAGNOSIS — Z20822 Contact with and (suspected) exposure to covid-19: Secondary | ICD-10-CM | POA: Insufficient documentation

## 2021-05-27 DIAGNOSIS — E119 Type 2 diabetes mellitus without complications: Secondary | ICD-10-CM

## 2021-05-27 DIAGNOSIS — N132 Hydronephrosis with renal and ureteral calculous obstruction: Secondary | ICD-10-CM | POA: Insufficient documentation

## 2021-05-27 DIAGNOSIS — Z419 Encounter for procedure for purposes other than remedying health state, unspecified: Secondary | ICD-10-CM

## 2021-05-27 DIAGNOSIS — N201 Calculus of ureter: Secondary | ICD-10-CM

## 2021-05-27 HISTORY — PX: CYSTOSCOPY WITH RETROGRADE PYELOGRAM, URETEROSCOPY AND STENT PLACEMENT: SHX5789

## 2021-05-27 LAB — URINALYSIS, ROUTINE W REFLEX MICROSCOPIC
Bilirubin Urine: NEGATIVE
Glucose, UA: NEGATIVE mg/dL
Ketones, ur: NEGATIVE mg/dL
Nitrite: NEGATIVE
Protein, ur: 100 mg/dL — AB
RBC / HPF: >50 RBC/hpf — ABNORMAL HIGH (ref 0–5)
Specific Gravity, Urine: 1.023 (ref 1.005–1.030)
WBC, UA: >50 WBC/hpf — ABNORMAL HIGH (ref 0–5)
pH: 5 (ref 5.0–8.0)

## 2021-05-27 LAB — CBC WITH DIFFERENTIAL/PLATELET
Abs Immature Granulocytes: 0.08 10*3/uL — ABNORMAL HIGH (ref 0.00–0.07)
Basophils Absolute: 0.1 10*3/uL (ref 0.0–0.1)
Basophils Relative: 1 %
Eosinophils Absolute: 0.2 10*3/uL (ref 0.0–0.5)
Eosinophils Relative: 1 %
HCT: 43.6 % (ref 39.0–52.0)
Hemoglobin: 14.2 g/dL (ref 13.0–17.0)
Immature Granulocytes: 1 %
Lymphocytes Relative: 24 %
Lymphs Abs: 2.8 10*3/uL (ref 0.7–4.0)
MCH: 29.6 pg (ref 26.0–34.0)
MCHC: 32.6 g/dL (ref 30.0–36.0)
MCV: 91 fL (ref 80.0–100.0)
Monocytes Absolute: 0.9 10*3/uL (ref 0.1–1.0)
Monocytes Relative: 8 %
Neutro Abs: 7.7 10*3/uL (ref 1.7–7.7)
Neutrophils Relative %: 65 %
Platelets: 274 10*3/uL (ref 150–400)
RBC: 4.79 MIL/uL (ref 4.22–5.81)
RDW: 13.1 % (ref 11.5–15.5)
WBC: 11.8 10*3/uL — ABNORMAL HIGH (ref 4.0–10.5)
nRBC: 0 % (ref 0.0–0.2)

## 2021-05-27 LAB — RESP PANEL BY RT-PCR (FLU A&B, COVID) ARPGX2
Influenza A by PCR: NEGATIVE
Influenza B by PCR: NEGATIVE
SARS Coronavirus 2 by RT PCR: NEGATIVE

## 2021-05-27 LAB — COMPREHENSIVE METABOLIC PANEL
ALT: 32 U/L (ref 0–44)
AST: 20 U/L (ref 15–41)
Albumin: 3.6 g/dL (ref 3.5–5.0)
Alkaline Phosphatase: 70 U/L (ref 38–126)
Anion gap: 10 (ref 5–15)
BUN: 11 mg/dL (ref 6–20)
CO2: 25 mmol/L (ref 22–32)
Calcium: 9.2 mg/dL (ref 8.9–10.3)
Chloride: 100 mmol/L (ref 98–111)
Creatinine, Ser: 0.96 mg/dL (ref 0.61–1.24)
GFR, Estimated: >60 mL/min (ref >60)
Glucose, Bld: 231 mg/dL — ABNORMAL HIGH (ref 70–99)
Potassium: 3.8 mmol/L (ref 3.5–5.1)
Sodium: 135 mmol/L (ref 135–145)
Total Bilirubin: 0.6 mg/dL (ref 0.3–1.2)
Total Protein: 7.3 g/dL (ref 6.5–8.1)

## 2021-05-27 LAB — LIPASE, BLOOD: Lipase: 38 U/L (ref 11–51)

## 2021-05-27 LAB — GLUCOSE, CAPILLARY: Glucose-Capillary: 109 mg/dL — ABNORMAL HIGH (ref 70–99)

## 2021-05-27 SURGERY — CYSTOURETEROSCOPY, WITH RETROGRADE PYELOGRAM AND STENT INSERTION
Anesthesia: General | Site: Penis | Laterality: Right

## 2021-05-27 MED ORDER — PROPOFOL 10 MG/ML IV BOLUS
INTRAVENOUS | Status: DC | PRN
  Administered 2021-05-27: 150 mg via INTRAVENOUS

## 2021-05-27 MED ORDER — SODIUM CHLORIDE 0.9 % IR SOLN
Status: DC | PRN
  Administered 2021-05-27: 3000 mL

## 2021-05-27 MED ORDER — MIDAZOLAM HCL 5 MG/5ML IJ SOLN
INTRAMUSCULAR | Status: DC | PRN
  Administered 2021-05-27: 1 mg via INTRAVENOUS

## 2021-05-27 MED ORDER — ONDANSETRON HCL 4 MG/2ML IJ SOLN
4.0000 mg | Freq: Once | INTRAMUSCULAR | Status: AC
  Administered 2021-05-27: 4 mg via INTRAVENOUS
  Filled 2021-05-27: qty 2

## 2021-05-27 MED ORDER — OXYCODONE-ACETAMINOPHEN 5-325 MG PO TABS
1.0000 | ORAL_TABLET | Freq: Once | ORAL | Status: AC
  Administered 2021-05-27: 1 via ORAL
  Filled 2021-05-27: qty 1

## 2021-05-27 MED ORDER — ONDANSETRON HCL 4 MG/2ML IJ SOLN
INTRAMUSCULAR | Status: DC | PRN
  Administered 2021-05-27: 4 mg via INTRAVENOUS

## 2021-05-27 MED ORDER — MORPHINE SULFATE (PF) 4 MG/ML IV SOLN
4.0000 mg | Freq: Once | INTRAVENOUS | Status: AC
  Administered 2021-05-27: 4 mg via INTRAVENOUS
  Filled 2021-05-27: qty 1

## 2021-05-27 MED ORDER — LACTATED RINGERS IV SOLN: INTRAVENOUS | Status: DC | PRN

## 2021-05-27 MED ORDER — METFORMIN HCL 500 MG PO TABS: 500.0000 mg | ORAL_TABLET | Freq: Two times a day (BID) | ORAL | 0 refills | Status: DC

## 2021-05-27 MED ORDER — 0.9 % SODIUM CHLORIDE (POUR BTL) OPTIME
TOPICAL | Status: DC | PRN
  Administered 2021-05-27: 1000 mL

## 2021-05-27 MED ORDER — FENTANYL CITRATE (PF) 100 MCG/2ML IJ SOLN
INTRAMUSCULAR | Status: DC | PRN
  Administered 2021-05-27 (×2): 25 ug via INTRAVENOUS
  Administered 2021-05-27: 50 ug via INTRAVENOUS

## 2021-05-27 MED ORDER — SULFAMETHOXAZOLE-TRIMETHOPRIM 800-160 MG PO TABS: 1.0000 | ORAL_TABLET | Freq: Two times a day (BID) | ORAL | 0 refills | Status: DC

## 2021-05-27 MED ORDER — LIDOCAINE 2% (20 MG/ML) 5 ML SYRINGE
INTRAMUSCULAR | Status: DC | PRN
  Administered 2021-05-27: 50 mg via INTRAVENOUS

## 2021-05-27 MED ORDER — HYDROCODONE-ACETAMINOPHEN 5-325 MG PO TABS: 1.0000 | ORAL_TABLET | ORAL | 0 refills | Status: DC | PRN

## 2021-05-27 MED ORDER — FENTANYL CITRATE (PF) 100 MCG/2ML IJ SOLN
25.0000 ug | INTRAMUSCULAR | Status: DC | PRN
Start: 2021-05-27 — End: 2021-05-28

## 2021-05-27 MED ORDER — FENTANYL CITRATE (PF) 250 MCG/5ML IJ SOLN
INTRAMUSCULAR | Status: AC
  Filled 2021-05-27: qty 5

## 2021-05-27 MED ORDER — TAMSULOSIN HCL 0.4 MG PO CAPS: 0.4000 mg | ORAL_CAPSULE | Freq: Every day | ORAL | 0 refills | Status: DC

## 2021-05-27 MED ORDER — SODIUM CHLORIDE 0.9 % IV SOLN
1.0000 g | Freq: Once | INTRAVENOUS | Status: AC
  Administered 2021-05-27: 1 g via INTRAVENOUS
  Filled 2021-05-27: qty 10

## 2021-05-27 MED ORDER — PROPOFOL 10 MG/ML IV BOLUS
INTRAVENOUS | Status: AC
  Filled 2021-05-27: qty 40

## 2021-05-27 MED ORDER — IOHEXOL 300 MG/ML  SOLN
INTRAMUSCULAR | Status: DC | PRN
  Administered 2021-05-27: 8 mL via URETHRAL

## 2021-05-27 MED ORDER — MIDAZOLAM HCL 2 MG/2ML IJ SOLN
INTRAMUSCULAR | Status: AC
  Filled 2021-05-27: qty 2

## 2021-05-27 SURGICAL SUPPLY — 20 items
BAG URINE DRAIN 2000ML AR STRL (UROLOGICAL SUPPLIES) ×2 IMPLANT
BAG URO CATCHER STRL LF (MISCELLANEOUS) ×2 IMPLANT
CATH FOLEY 2WAY SLVR  5CC 16FR (CATHETERS)
CATH FOLEY 2WAY SLVR 5CC 16FR (CATHETERS) IMPLANT
CATH URET 5FR 28IN OPEN ENDED (CATHETERS) ×2 IMPLANT
GLOVE SURG ENC MOIS LTX SZ6.5 (GLOVE) ×2 IMPLANT
GOWN STRL REUS W/ TWL LRG LVL3 (GOWN DISPOSABLE) ×1 IMPLANT
GOWN STRL REUS W/ TWL XL LVL3 (GOWN DISPOSABLE) ×1 IMPLANT
GOWN STRL REUS W/TWL LRG LVL3 (GOWN DISPOSABLE) ×1
GOWN STRL REUS W/TWL XL LVL3 (GOWN DISPOSABLE) ×1
GUIDEWIRE ANG ZIPWIRE 038X150 (WIRE) IMPLANT
GUIDEWIRE STR DUAL SENSOR (WIRE) ×2 IMPLANT
KIT TURNOVER KIT B (KITS) ×2 IMPLANT
MANIFOLD NEPTUNE II (INSTRUMENTS) IMPLANT
NS IRRIG 1000ML POUR BTL (IV SOLUTION) IMPLANT
PACK CYSTO (CUSTOM PROCEDURE TRAY) ×2 IMPLANT
STENT URET 6FRX24 CONTOUR (STENTS) ×2 IMPLANT
TOWEL GREEN STERILE FF (TOWEL DISPOSABLE) ×2 IMPLANT
TUBE CONNECTING 12X1/4 (SUCTIONS) ×2 IMPLANT
WATER STERILE IRR 3000ML UROMA (IV SOLUTION) IMPLANT

## 2021-05-27 NOTE — ED Provider Notes (Signed)
Care assumed from Elpidio Anis, PA-C at shift change pending CT renal study. See her note for full HPI.  In short, patient is a 45 year old male who presents to the ED due to epigastric and right upper quadrant abdominal pain that has been persistent for the past 5 days.  Epigastric pain associate with nausea.  No vomiting.  Pain extends to back/flank region on the right.  He also endorses dysuria.  Last bowel movement was tonight.  Plan from previous provider: Low suspicion for cardiac etiology of pain.  Patient found to be hyperglycemic at 231.  Discharge patient with metformin and follow-up with PCP.  UA significant for acute cystitis.  Patient given Rocephin prior to shift change.  Plan to discharge with Keflex if renal study negative.  Physical Exam  BP (!) 143/85   Pulse 69   Temp 98 F (36.7 C)   Resp 17   SpO2 97%   Physical Exam Vitals and nursing note reviewed.  Constitutional:      General: He is not in acute distress.    Appearance: He is not ill-appearing.  HENT:     Head: Normocephalic.  Eyes:     Pupils: Pupils are equal, round, and reactive to light.  Cardiovascular:     Rate and Rhythm: Normal rate and regular rhythm.     Pulses: Normal pulses.     Heart sounds: Normal heart sounds. No murmur heard.   No friction rub. No gallop.  Pulmonary:     Effort: Pulmonary effort is normal.     Breath sounds: Normal breath sounds.  Abdominal:     General: Abdomen is flat. There is no distension.     Palpations: Abdomen is soft.     Tenderness: There is no abdominal tenderness. There is no guarding or rebound.  Musculoskeletal:        General: Normal range of motion.     Cervical back: Neck supple.  Skin:    General: Skin is warm and dry.  Neurological:     General: No focal deficit present.     Mental Status: He is alert.  Psychiatric:        Mood and Affect: Mood normal.        Behavior: Behavior normal.    ED Course/Procedures     Procedures  MDM  Care  assumed from Elpidio Anis, PA-C at shift change pending CT renal study. See her note for full MDM.  45 year old male presents to the ED due to epigastric and right upper quadrant pain that radiates throughout right flank and low back associated with nausea and dysuria.  UA significant for UTI.  Rocephin given by previous provider.  CMP significant for hyperglycemia 231.  No history of diabetes.  Plan to discharge with metformin.  CBC significant for mild leukocytosis at 11.8.  Lipase normal at 38.  Low suspicion for pancreatitis.  LFTs within normal limits.   CT renal study personally reviewed which demonstrates a 4 mm stone in right ureter.  Will consult urology given UA positive for infection.  Patient reassessed at bedside and notes continued pain.  He also endorses some nausea.  Morphine and Zofran given.  7:55 AM Discussed with Dr. Arita Miss with urology who will place a stent. Patient last drank at midnight. COVID test pending. Dr. Arita Miss will most likely discharge from OR following stent placement pending findings.   Patient made aware of finding and treatment plan. Patient agreeable.       Mannie Stabile,  PA-C 05/27/21 0757    Dione Booze, MD 05/27/21 [phone]

## 2021-05-27 NOTE — Anesthesia Postprocedure Evaluation (Signed)
Anesthesia Post Note  Patient: Antonio Creswell Aguilera  Procedure(s) Performed: CYSTOSCOPY WITH RETROGRADE PYELOGRAM, URETEROSCOPY AND STENT PLACEMENT (Right: Penis)     Patient location during evaluation: PACU Anesthesia Type: General Level of consciousness: awake and alert Pain management: pain level controlled Vital Signs Assessment: post-procedure vital signs reviewed and stable Respiratory status: spontaneous breathing, nonlabored ventilation and respiratory function stable Cardiovascular status: blood pressure returned to baseline and stable Postop Assessment: no apparent nausea or vomiting Anesthetic complications: no   No notable events documented.  Last Vitals:  Vitals:   05/27/21 1330 05/27/21 1343  BP: (!) 120/91 (!) 139/103  Pulse: 69 76  Resp: 20 14  Temp:  (!) 36.3 C  SpO2: 92% 92%    Last Pain:  Vitals:   05/27/21 1343  TempSrc:   PainSc: 0-No pain                 Vallen Calabrese,W. EDMOND

## 2021-05-27 NOTE — Discharge Instructions (Addendum)

## 2021-05-27 NOTE — ED Triage Notes (Signed)
Pt reports abd pain that radiates to his R flank area for the past few days, fevers and dysuria

## 2021-05-27 NOTE — ED Provider Notes (Addendum)
[facility] EMERGENCY DEPARTMENT Provider Note   CSN: [id number] Arrival date & time: 05/27/21  0341     History Chief Complaint  Patient presents with   Abdominal Pain    Tristan Hall is a 45 y.o. male.  Patient reports fever on and out for the last 5 days, onset epigastric and RUQ abdominal pain yesterday. The pain is constant, causing nausea, no vomiting. It extends into the back/flank area on right. He is also reporting burning with urination. Last BM tonight.  The history is provided by the patient. No language interpreter was used.  Abdominal Pain Associated symptoms: dysuria, fever and nausea   Associated symptoms: no vomiting       Past Medical History:  Diagnosis Date   Hypercholesteremia     Patient Active Problem List   Diagnosis Date Noted   Acute appendicitis 08/13/2015   Appendicitis, acute 08/13/2015    Past Surgical History:  Procedure Laterality Date   LAPAROSCOPIC APPENDECTOMY N/A 08/13/2015   Procedure: APPENDECTOMY LAPAROSCOPIC;  Surgeon: Emelia Loron, MD;  Location: [facility] OR;  Service: General;  Laterality: N/A;       No family history on file.  Social History   Tobacco Use   Smoking status: Never   Smokeless tobacco: Never  Substance Use Topics   Alcohol use: Yes    Comment: occ   Drug use: Never    Home Medications Prior to Admission medications   Medication Sig Start Date End Date Taking? Authorizing Provider  doxycycline (VIBRAMYCIN) 100 MG capsule Take 1 capsule (100 mg total) by mouth 2 (two) times daily. 10/03/19   Roxy Horseman, PA-C    Allergies    Patient has no known allergies.  Review of Systems   Review of Systems  Constitutional:  Positive for fever.  HENT: Negative.    Respiratory: Negative.    Cardiovascular: Negative.   Gastrointestinal:  Positive for abdominal pain and nausea. Negative for blood in stool and vomiting.  Genitourinary:  Positive for dysuria and flank pain.   Musculoskeletal:  Negative for myalgias.   Physical Exam Updated Vital Signs BP (!) 154/90 (BP Location: Left Arm)   Pulse 84   Temp 98 F (36.7 C)   Resp 17   SpO2 96%   Physical Exam Vitals and nursing note reviewed.  Constitutional:      Appearance: He is well-developed.  Pulmonary:     Effort: Pulmonary effort is normal.  Abdominal:     Palpations: Abdomen is soft.     Tenderness: There is abdominal tenderness in the right upper quadrant and epigastric area. There is right CVA tenderness.  Musculoskeletal:        General: Normal range of motion.     Cervical back: Normal range of motion.  Skin:    General: Skin is warm and dry.  Neurological:     Mental Status: He is alert and oriented to person, place, and time.    ED Results / Procedures / Treatments   Labs (all labs ordered are listed, but only abnormal results are displayed) Labs Reviewed  CBC WITH DIFFERENTIAL/PLATELET - Abnormal; Notable for the following components:      Result Value   WBC 11.8 (*)    Abs Immature Granulocytes 0.08 (*)    All other components within normal limits  URINALYSIS, ROUTINE W REFLEX MICROSCOPIC - Abnormal; Notable for the following components:   APPearance CLOUDY (*)    Hgb urine dipstick LARGE (*)    Protein,  ur 100 (*)    Leukocytes,Ua LARGE (*)    RBC / HPF >50 (*)    WBC, UA >50 (*)    Bacteria, UA MANY (*)    All other components within normal limits  LIPASE, BLOOD  COMPREHENSIVE METABOLIC PANEL   Results for orders placed or performed during the hospital encounter of 05/27/21  CBC with Differential  Result Value Ref Range   WBC 11.8 (H) 4.0 - 10.5 K/uL   RBC 4.79 4.22 - 5.81 MIL/uL   Hemoglobin 14.2 13.0 - 17.0 g/dL   HCT 44.9 20.1 - 00.7 %   MCV 91.0 80.0 - 100.0 fL   MCH 29.6 26.0 - 34.0 pg   MCHC 32.6 30.0 - 36.0 g/dL   RDW 12.1 97.5 - 88.3 %   Platelets 274 150 - 400 K/uL   nRBC 0.0 0.0 - 0.2 %   Neutrophils Relative % 65 %   Neutro Abs 7.7 1.7 - 7.7  K/uL   Lymphocytes Relative 24 %   Lymphs Abs 2.8 0.7 - 4.0 K/uL   Monocytes Relative 8 %   Monocytes Absolute 0.9 0.1 - 1.0 K/uL   Eosinophils Relative 1 %   Eosinophils Absolute 0.2 0.0 - 0.5 K/uL   Basophils Relative 1 %   Basophils Absolute 0.1 0.0 - 0.1 K/uL   Immature Granulocytes 1 %   Abs Immature Granulocytes 0.08 (H) 0.00 - 0.07 K/uL  Lipase, blood  Result Value Ref Range   Lipase 38 11 - 51 U/L  Comprehensive metabolic panel  Result Value Ref Range   Sodium 135 135 - 145 mmol/L   Potassium 3.8 3.5 - 5.1 mmol/L   Chloride 100 98 - 111 mmol/L   CO2 25 22 - 32 mmol/L   Glucose, Bld 231 (H) 70 - 99 mg/dL   BUN 11 6 - 20 mg/dL   Creatinine, Ser 2.54 0.61 - 1.24 mg/dL   Calcium 9.2 8.9 - 98.2 mg/dL   Total Protein 7.3 6.5 - 8.1 g/dL   Albumin 3.6 3.5 - 5.0 g/dL   AST 20 15 - 41 U/L   ALT 32 0 - 44 U/L   Alkaline Phosphatase 70 38 - 126 U/L   Total Bilirubin 0.6 0.3 - 1.2 mg/dL   GFR, Estimated >64 >15 mL/min   Anion gap 10 5 - 15  Urinalysis, Routine w reflex microscopic Urine, Clean Catch  Result Value Ref Range   Color, Urine YELLOW YELLOW   APPearance CLOUDY (A) CLEAR   Specific Gravity, Urine 1.023 1.005 - 1.030   pH 5.0 5.0 - 8.0   Glucose, UA NEGATIVE NEGATIVE mg/dL   Hgb urine dipstick LARGE (A) NEGATIVE   Bilirubin Urine NEGATIVE NEGATIVE   Ketones, ur NEGATIVE NEGATIVE mg/dL   Protein, ur 830 (A) NEGATIVE mg/dL   Nitrite NEGATIVE NEGATIVE   Leukocytes,Ua LARGE (A) NEGATIVE   RBC / HPF >50 (H) 0 - 5 RBC/hpf   WBC, UA >50 (H) 0 - 5 WBC/hpf   Bacteria, UA MANY (A) NONE SEEN   WBC Clumps PRESENT    Mucus PRESENT    Ca Oxalate Crys, UA PRESENT      EKG None  Radiology No results found.  Procedures Procedures   Medications Ordered in ED Medications  oxyCODONE-acetaminophen (PERCOCET/ROXICET) 5-325 MG per tablet 1 tablet (1 tablet Oral Given 05/27/21 0428)    ED Course  I have reviewed the triage vital signs and the nursing  notes.  Pertinent labs & imaging results  that were available during my care of the patient were reviewed by me and considered in my medical decision making (see chart for details).    MDM Rules/Calculators/A&P                           Patient to ED with upper abdominal pain that goes around to right flank, as well as dysuria as detailed in HPI. Also reports fever on and off all week.   He appears uncomfortable. Pain addressed during the triage process. No vomiting. Labs pending.   Glucose is 231 without previous diagnosis of DM. He reports +FH of same. Will start on Metformin and strongly encourage continued treatment with a PCP. Referral made.   He has a UTI suspected to be the cause of his symptoms. There is blood and crystals present. Will obtain CT scan to r/o presence of stone that would necessitate urgent intervention. IV Rocephin provided.   Patient care signed out to [facility], PA-C, pending CT for review and appropriate disposition.     Final Clinical Impression(s) / ED Diagnoses Final diagnoses:  None   UTI vs pyelonephritis New onset diabetes  Rx / DC Orders ED Discharge Orders     None        Elpidio Anis, PA-C 05/27/21 0654    Elpidio Anis, PA-C 05/27/21 6381    Dione Booze, MD 05/27/21 [id number]

## 2021-05-27 NOTE — Transfer of Care (Signed)
Immediate Anesthesia Transfer of Care Note  Patient: Tristan Hall  Procedure(s) Performed: CYSTOSCOPY WITH RETROGRADE PYELOGRAM, URETEROSCOPY AND STENT PLACEMENT (Right: Penis)  Patient Location: PACU  Anesthesia Type:General  Level of Consciousness: drowsy  Airway & Oxygen Therapy: Patient connected to nasal cannula oxygen  Post-op Assessment: Report given to RN and Post -op Vital signs reviewed and stable  Post vital signs: Reviewed and stable  Last Vitals:  Vitals Value Taken Time  BP 142/73 05/27/21 1315  Temp 36.1 C 05/27/21 1315  Pulse 78 05/27/21 1317  Resp 22 05/27/21 1317  SpO2 91 % 05/27/21 1317  Vitals shown include unvalidated device data.  Last Pain:  Vitals:   05/27/21 1200  TempSrc: Oral  PainSc:          Complications: No notable events documented.

## 2021-05-27 NOTE — ED Provider Notes (Signed)
MSE was initiated and I personally evaluated the patient and placed orders (if any) at  4:08 AM on May 27, 2021.  Patient reports fever on and out for the last 5 days, onset epigastric and RUQ abdominal pain yesterday. The pain is constant, causing nausea, no vomiting. It extends into the back/flank area on right. He is also reporting burning with urination. Last BM tonight.   Today's Vitals   05/27/21 0355 05/27/21 0358  BP: (!) 154/90   Pulse: 84   Resp: 17   Temp: 98 F (36.7 C)   SpO2: 96%   PainSc:  7    There is no height or weight on file to calculate BMI.  Obese abdomen, ?distension   The patient appears stable so that the remainder of the MSE may be completed by another provider.   Elpidio Anis, PA-C 05/27/21 0411    Dione Booze, MD 05/27/21 [id number]

## 2021-05-27 NOTE — ED Notes (Signed)
Consent form filled out, however not signed. The patient states that he hasn't spoken to someone from surgery/OR regarding the procedure. Consent at bedside until informed consent can be provided.

## 2021-05-27 NOTE — Anesthesia Preprocedure Evaluation (Addendum)
Anesthesia Evaluation  Patient identified by MRN, date of birth, ID band Patient awake    Reviewed: Allergy & Precautions, H&P , NPO status , Patient's Chart, lab work & pertinent test results  Airway Mallampati: III  TM Distance: >3 FB Neck ROM: Full    Dental no notable dental hx. (+) Teeth Intact, Dental Advisory Given   Pulmonary neg pulmonary ROS,    Pulmonary exam normal breath sounds clear to auscultation       Cardiovascular negative cardio ROS   Rhythm:Regular Rate:Normal     Neuro/Psych negative neurological ROS  negative psych ROS   GI/Hepatic negative GI ROS, Neg liver ROS,   Endo/Other  negative endocrine ROS  Renal/GU negative Renal ROS  negative genitourinary   Musculoskeletal   Abdominal   Peds  Hematology negative hematology ROS (+)   Anesthesia Other Findings   Reproductive/Obstetrics negative OB ROS                            Anesthesia Physical Anesthesia Plan  ASA: 2  Anesthesia Plan: General   Post-op Pain Management: Minimal or no pain anticipated and Toradol IV (intra-op)   Induction: Intravenous  PONV Risk Score and Plan: 3 and Ondansetron, Dexamethasone and Midazolam  Airway Management Planned: LMA  Additional Equipment:   Intra-op Plan:   Post-operative Plan: Extubation in OR  Informed Consent: I have reviewed the patients History and Physical, chart, labs and discussed the procedure including the risks, benefits and alternatives for the proposed anesthesia with the patient or authorized representative who has indicated his/her understanding and acceptance.     Dental advisory given  Plan Discussed with: CRNA  Anesthesia Plan Comments:        Anesthesia Quick Evaluation

## 2021-05-27 NOTE — Consult Note (Signed)
I have been asked to see the patient by Dr. Marianna Fuss for evaluation and management of obstructing right ureteral calculus with UTI.  History of present illness: 45 year old man presented to the ED with acute onset right flank pain found to have a 4 mm obstructing right mid ureteral calculus associated with UTI.  No fever.   This is patient's first kidney stone.  He has never had surgery for kidney stones.   Review of systems: A 12 point comprehensive review of systems was obtained and is negative unless otherwise stated in the history of present illness.  Patient Active Problem List   Diagnosis Date Noted   Acute appendicitis 08/13/2015   Appendicitis, acute 08/13/2015    No current facility-administered medications on file prior to encounter.   No current outpatient medications on file prior to encounter.    Past Medical History:  Diagnosis Date   Hypercholesteremia     Past Surgical History:  Procedure Laterality Date   LAPAROSCOPIC APPENDECTOMY N/A 08/13/2015   Procedure: APPENDECTOMY LAPAROSCOPIC;  Surgeon: Emelia Loron, MD;  Location: [facility] OR;  Service: General;  Laterality: N/A;    Social History   Tobacco Use   Smoking status: Never   Smokeless tobacco: Never  Substance Use Topics   Alcohol use: Yes    Comment: occ   Drug use: Never    No family history on file.  PE: Vitals:   05/27/21 0545 05/27/21 0600 05/27/21 0630 05/27/21 0735  BP:  (!) 152/95  (!) 149/93  Pulse: 78 74 68 68  Resp:  16  15  Temp:      SpO2: 96% 91% 93% 95%   Patient appears to be in no acute distress  patient is alert and oriented x3 Atraumatic normocephalic head No increased work of breathing, no audible wheezes/rhonchi Regular sinus rhythm/rate Abdomen is soft, nontender, nondistended, no CVA or suprapubic tenderness Lower extremities are symmetric without appreciable edema Grossly neurologically intact No identifiable skin lesions  Recent Labs    05/27/21 0415   WBC 11.8*  HGB 14.2  HCT 43.6   Recent Labs    05/27/21 0415  NA 135  K 3.8  CL 100  CO2 25  GLUCOSE 231*  BUN 11  CREATININE 0.96  CALCIUM 9.2   No results for input(s): LABPT, INR in the last 72 hours. No results for input(s): LABURIN in the last 72 hours. Results for orders placed or performed in visit on 05/03/19  Gonococcus culture     Status: None   Collection Time: 05/03/19 10:30 AM   Specimen: Urethra; Genital   UE  Result Value Ref Range Status   GC Culture Only Final report  Final   Result 1 Comment  Final    Comment: No Neisseria gonorrhoeae isolated.  Gram stain     Status: None   Collection Time: 05/03/19 10:51 AM  Result Value Ref Range Status   Gram Stain Result Comment:  Final    Comment: <2WBC/HPF;INTRACELLULAR GND ABSENT    Imaging: CT Abd/Pelvis 05/27/21 IMPRESSION: 1. 4 mm calculus in the distal right ureter with mild right hydroureteronephrosis. 2. Unchanged hepatic steatosis.     Electronically Signed   By: Obie Dredge M.D.   On: 05/27/2021 07:27  Imp/Recommendations: Obstructing right ureteral calculus with UTI: -risks and benefits of right ureteral stent placement discussed including bleeding, infection, stent discomfort, pain, need for additional surgery, inability to place stent, damage to surrounding structures -Informed consent obtained -Patient will need a staged ureteroscopy  to schedule in 2-3 -to OR today  Please page with any further questions or concerns. Gray Maugeri D Dale Strausser

## 2021-05-27 NOTE — ED Notes (Signed)
Patient transported to CT 

## 2021-05-27 NOTE — Anesthesia Procedure Notes (Signed)
Procedure Name: LMA Insertion Date/Time: 05/27/2021 12:34 PM Performed by: Gwenyth Allegra, CRNA Pre-anesthesia Checklist: Emergency Drugs available, Suction available, Patient being monitored and Timeout performed Patient Re-evaluated:Patient Re-evaluated prior to induction Oxygen Delivery Method: Circle system utilized Preoxygenation: Pre-oxygenation with 100% oxygen Induction Type: IV induction LMA: LMA inserted LMA Size: 4.0 Number of attempts: 1 Placement Confirmation: positive ETCO2 and breath sounds checked- equal and bilateral Tube secured with: Tape Dental Injury: Teeth and Oropharynx as per pre-operative assessment

## 2021-05-27 NOTE — H&P (View-Only) (Signed)
I have been asked to see the patient by Dr. Richard Dykstra for evaluation and management of obstructing right ureteral calculus with UTI.  History of present illness: Tristan Hall presented to the ED with acute onset right flank pain found to have a 4 mm obstructing right mid ureteral calculus associated with UTI.  No fever.   This is patient's first kidney stone.  He has never had surgery for kidney stones.   Review of systems: A 12 point comprehensive review of systems was obtained and is negative unless otherwise stated in the history of present illness.  Patient Active Problem List   Diagnosis Date Noted   Acute appendicitis 08/13/2015   Appendicitis, acute 08/13/2015    No current facility-administered medications on file prior to encounter.   No current outpatient medications on file prior to encounter.    Past Medical History:  Diagnosis Date   Hypercholesteremia     Past Surgical History:  Procedure Laterality Date   LAPAROSCOPIC APPENDECTOMY N/A 08/13/2015   Procedure: APPENDECTOMY LAPAROSCOPIC;  Surgeon: Matthew Wakefield, MD;  Location: [facility] OR;  Service: General;  Laterality: N/A;    Social History   Tobacco Use   Smoking status: Never   Smokeless tobacco: Never  Substance Use Topics   Alcohol use: Yes    Comment: occ   Drug use: Never    No family history on file.  PE: Vitals:   05/27/21 0545 05/27/21 0600 05/27/21 0630 05/27/21 0735  BP:  (!) 152/95  (!) 149/93  Pulse: 78 74 68 68  Resp:  16  15  Temp:      SpO2: 96% 91% 93% 95%   Patient appears to be in no acute distress  patient is alert and oriented x3 Atraumatic normocephalic head No increased work of breathing, no audible wheezes/rhonchi Regular sinus rhythm/rate Abdomen is soft, nontender, nondistended, no CVA or suprapubic tenderness Lower extremities are symmetric without appreciable edema Grossly neurologically intact No identifiable skin lesions  Recent Labs    05/27/21 0415   WBC 11.8*  HGB 14.2  HCT 43.6   Recent Labs    05/27/21 0415  NA 135  K 3.8  CL 100  CO2 25  GLUCOSE 231*  BUN 11  CREATININE 0.96  CALCIUM 9.2   No results for input(s): LABPT, INR in the last 72 hours. No results for input(s): LABURIN in the last 72 hours. Results for orders placed or performed in visit on 05/03/19  Gonococcus culture     Status: None   Collection Time: 05/03/19 10:30 AM   Specimen: Urethra; Genital   UE  Result Value Ref Range Status   GC Culture Only Final report  Final   Result 1 Comment  Final    Comment: No Neisseria gonorrhoeae isolated.  Gram stain     Status: None   Collection Time: 05/03/19 10:51 AM  Result Value Ref Range Status   Gram Stain Result Comment:  Final    Comment: <2WBC/HPF;INTRACELLULAR GND ABSENT    Imaging: CT Abd/Pelvis 05/27/21 IMPRESSION: 1. 4 mm calculus in the distal right ureter with mild right hydroureteronephrosis. 2. Unchanged hepatic steatosis.     Electronically Signed   By: William T Derry M.D.   On: 05/27/2021 07:27  Imp/Recommendations: Obstructing right ureteral calculus with UTI: -risks and benefits of right ureteral stent placement discussed including bleeding, infection, stent discomfort, pain, need for additional surgery, inability to place stent, damage to surrounding structures -Informed consent obtained -Patient will need a staged ureteroscopy   to schedule in 2-3 -to OR today  Please page with any further questions or concerns. Dally Oshel D Xzavior Reinig

## 2021-05-27 NOTE — Op Note (Signed)
Preoperative diagnosis:  Right ureteral calculus with UTI   Postoperative diagnosis:  Right ureteral calculus with UTI   Procedure:  Cystoscopy right ureteral stent placement 6Fr x 24cm no tether right retrograde pyelography with interpretation   Surgeon: Kasandra Knudsen, MD  Anesthesia: General  Complications: None    EBL: Minimal  Specimens: None  Findings: 1.  Normal anterior urethra 2.  Trilobar prostatic hypertrophy that bled easily 3.  Bilateral ureteral orifices in orthotopic position 4.  Patchy erythema consistent with UTI 5. No masses seen 6. right retrograde pyelography demonstrated a filling defect within the right ureter consistent with the patient's known calculus without other abnormalities.   Indication: Tristan Hall is a 45 y.o. patient with 4 mm right obstructing ureteral calculus with a urinary tract infection. After reviewing the management options for treatment, he elected to proceed with the above surgical procedure(s). We have discussed the potential benefits and risks of the procedure, side effects of the proposed treatment, the likelihood of the patient achieving the goals of the procedure, and any potential problems that might occur during the procedure or recuperation. Informed consent has been obtained.  Description of procedure:  The patient was taken to the operating room and general anesthesia was induced.  The patient was placed in the dorsal lithotomy position, prepped and draped in the usual sterile fashion, and preoperative antibiotics were administered. A preoperative time-out was performed.   Cystourethroscopy was performed.  The patient's urethra was examined and was normal. The bladder was then systematically examined in its entirety. There was no evidence for any bladder tumors, stones, or other mucosal pathology.    Attention then turned to the rightureteral orifice and a ureteral catheter was used to intubate the ureteral  orifice.  Omnipaque contrast was injected through the ureteral catheter and a retrograde pyelogram was performed with findings as dictated above.  A 0.38 sensor guidewire was then advanced through the open-ended ureteral catheter and the catheter was removed.  The ureteral stent was advance over the wire using Seldinger technique.  The stent was positioned appropriately under fluoroscopic and cystoscopic guidance.  The wire was then removed with an adequate stent curl noted in the renal pelvis as well as in the bladder.  The bladder was then emptied and the procedure ended.  The patient appeared to tolerate the procedure well and without complications.  The patient was able to be awakened and transferred to the recovery unit in satisfactory condition.   Plan: he will need to be scheduled for ureteroscopy in 2-3 weeks for definitive removal of stone  Kasandra Knudsen, M.D.

## 2021-05-28 ENCOUNTER — Encounter (HOSPITAL_COMMUNITY): Payer: Self-pay | Admitting: Urology

## 2021-05-28 LAB — GC/CHLAMYDIA PROBE AMP (~~LOC~~) NOT AT ARMC
Chlamydia: NEGATIVE
Comment: NEGATIVE
Comment: NORMAL
Neisseria Gonorrhea: NEGATIVE

## 2021-06-09 ENCOUNTER — Other Ambulatory Visit: Payer: Self-pay | Admitting: Urology

## 2021-06-11 ENCOUNTER — Encounter (HOSPITAL_BASED_OUTPATIENT_CLINIC_OR_DEPARTMENT_OTHER): Payer: Self-pay | Admitting: Urology

## 2021-06-11 ENCOUNTER — Other Ambulatory Visit: Payer: Self-pay

## 2021-06-11 DIAGNOSIS — R200 Anesthesia of skin: Secondary | ICD-10-CM

## 2021-06-11 HISTORY — DX: Anesthesia of skin: R20.0

## 2021-06-11 NOTE — Progress Notes (Signed)
Spoke w/ via phone for pre-op interview---pt with spanish pacific interpreter number [phone] Lab needs dos----      I stat ekg         Lab results------last labs done 05-27-2021 epic before 05-27-2021 surgery COVID test -----patient states asymptomatic no test needed Arrive at -------1230 pm 06-15-2021 NPO after MN NO Solid Food.   Water  from MN until---1130 am then npo Med rec completed Medications to take morning of surgery -----none Diabetic medication -----none day of surgery Patient instructed to bring photo id and insurance card day of surgery Patient aware to have Driver (ride ) / caregiver    for 24 hours after surgery  driver home boss or friend, adult caregiver night of surgery adult son Granvil and his wife Patient Special Instructions -----none Pre-Op special Istructions -----none Patient verbalized understanding of instructions that were given at this phone interview. Patient denies shortness of breath, chest pain, fever, cough at this phone interview.   Spanish interpreter requested for day of surgery email on chart.

## 2021-06-15 ENCOUNTER — Encounter (HOSPITAL_BASED_OUTPATIENT_CLINIC_OR_DEPARTMENT_OTHER): Payer: Self-pay | Admitting: Urology

## 2021-06-15 ENCOUNTER — Ambulatory Visit (HOSPITAL_BASED_OUTPATIENT_CLINIC_OR_DEPARTMENT_OTHER)
Admission: RE | Admit: 2021-06-15 | Discharge: 2021-06-15 | Disposition: A | Discharge status: Home / Self Care | Payer: Self-pay | Attending: Urology | Admitting: Urology

## 2021-06-15 ENCOUNTER — Ambulatory Visit (HOSPITAL_BASED_OUTPATIENT_CLINIC_OR_DEPARTMENT_OTHER): Payer: Self-pay | Admitting: Anesthesiology

## 2021-06-15 ENCOUNTER — Encounter (HOSPITAL_BASED_OUTPATIENT_CLINIC_OR_DEPARTMENT_OTHER)
Admission: RE | Disposition: A | Discharge status: Home / Self Care | Payer: Self-pay | Source: Home / Self Care | Attending: Urology

## 2021-06-15 ENCOUNTER — Other Ambulatory Visit: Payer: Self-pay

## 2021-06-15 DIAGNOSIS — Z7984 Long term (current) use of oral hypoglycemic drugs: Secondary | ICD-10-CM | POA: Insufficient documentation

## 2021-06-15 DIAGNOSIS — N136 Pyonephrosis: Secondary | ICD-10-CM | POA: Insufficient documentation

## 2021-06-15 DIAGNOSIS — E119 Type 2 diabetes mellitus without complications: Secondary | ICD-10-CM | POA: Insufficient documentation

## 2021-06-15 DIAGNOSIS — K760 Fatty (change of) liver, not elsewhere classified: Secondary | ICD-10-CM | POA: Insufficient documentation

## 2021-06-15 DIAGNOSIS — N400 Enlarged prostate without lower urinary tract symptoms: Secondary | ICD-10-CM | POA: Insufficient documentation

## 2021-06-15 DIAGNOSIS — E669 Obesity, unspecified: Secondary | ICD-10-CM | POA: Insufficient documentation

## 2021-06-15 DIAGNOSIS — Z6838 Body mass index (BMI) 38.0-38.9, adult: Secondary | ICD-10-CM | POA: Insufficient documentation

## 2021-06-15 HISTORY — DX: Type 2 diabetes mellitus without complications: E11.9

## 2021-06-15 HISTORY — DX: Hematuria, unspecified: R31.9

## 2021-06-15 HISTORY — PX: CYSTOSCOPY/URETEROSCOPY/HOLMIUM LASER/STENT PLACEMENT: SHX6546

## 2021-06-15 LAB — POCT I-STAT, CHEM 8
BUN: 13 mg/dL (ref 6–20)
Calcium, Ion: 1.27 mmol/L (ref 1.15–1.40)
Chloride: 103 mmol/L (ref 98–111)
Creatinine, Ser: 0.8 mg/dL (ref 0.61–1.24)
Glucose, Bld: 99 mg/dL (ref 70–99)
HCT: 43 % (ref 39.0–52.0)
Hemoglobin: 14.6 g/dL (ref 13.0–17.0)
Potassium: 4 mmol/L (ref 3.5–5.1)
Sodium: 141 mmol/L (ref 135–145)
TCO2: 25 mmol/L (ref 22–32)

## 2021-06-15 LAB — GLUCOSE, CAPILLARY: Glucose-Capillary: 91 mg/dL (ref 70–99)

## 2021-06-15 SURGERY — CYSTOSCOPY/URETEROSCOPY/HOLMIUM LASER/STENT PLACEMENT
Anesthesia: General | Site: Ureter | Laterality: Right

## 2021-06-15 MED ORDER — HYDROMORPHONE HCL 1 MG/ML IJ SOLN: 0.2500 mg | INTRAMUSCULAR | Status: DC | PRN

## 2021-06-15 MED ORDER — CIPROFLOXACIN HCL 500 MG PO TABS: 500.0000 mg | ORAL_TABLET | Freq: Once | ORAL | 0 refills | Status: AC

## 2021-06-15 MED ORDER — CEFAZOLIN SODIUM-DEXTROSE 2-4 GM/100ML-% IV SOLN
INTRAVENOUS | Status: AC
  Filled 2021-06-15: qty 100

## 2021-06-15 MED ORDER — MIDAZOLAM HCL 2 MG/2ML IJ SOLN
INTRAMUSCULAR | Status: AC
  Filled 2021-06-15: qty 2

## 2021-06-15 MED ORDER — MEPERIDINE HCL 25 MG/ML IJ SOLN: 6.2500 mg | INTRAMUSCULAR | Status: DC | PRN

## 2021-06-15 MED ORDER — FENTANYL CITRATE (PF) 100 MCG/2ML IJ SOLN
INTRAMUSCULAR | Status: AC
  Filled 2021-06-15: qty 2

## 2021-06-15 MED ORDER — DEXAMETHASONE SODIUM PHOSPHATE 10 MG/ML IJ SOLN
INTRAMUSCULAR | Status: DC | PRN
  Administered 2021-06-15: 4 mg via INTRAVENOUS

## 2021-06-15 MED ORDER — KETOROLAC TROMETHAMINE 30 MG/ML IJ SOLN: 30.0000 mg | Freq: Once | INTRAMUSCULAR | Status: DC | PRN

## 2021-06-15 MED ORDER — ACETAMINOPHEN 500 MG PO TABS
1000.0000 mg | ORAL_TABLET | Freq: Once | ORAL | Status: AC
  Administered 2021-06-15: 1000 mg via ORAL

## 2021-06-15 MED ORDER — PROMETHAZINE HCL 25 MG/ML IJ SOLN: 6.2500 mg | INTRAMUSCULAR | Status: DC | PRN

## 2021-06-15 MED ORDER — SODIUM CHLORIDE 0.9 % IR SOLN
Status: DC | PRN
  Administered 2021-06-15: 1500 mL

## 2021-06-15 MED ORDER — AMISULPRIDE (ANTIEMETIC) 5 MG/2ML IV SOLN: 10.0000 mg | Freq: Once | INTRAVENOUS | Status: DC | PRN

## 2021-06-15 MED ORDER — PHENYLEPHRINE 40 MCG/ML (10ML) SYRINGE FOR IV PUSH (FOR BLOOD PRESSURE SUPPORT)
PREFILLED_SYRINGE | INTRAVENOUS | Status: DC | PRN
  Administered 2021-06-15: 80 ug via INTRAVENOUS

## 2021-06-15 MED ORDER — CEFAZOLIN SODIUM-DEXTROSE 2-4 GM/100ML-% IV SOLN
2.0000 g | INTRAVENOUS | Status: AC
  Administered 2021-06-15: 2 g via INTRAVENOUS

## 2021-06-15 MED ORDER — KETOROLAC TROMETHAMINE 30 MG/ML IJ SOLN
INTRAMUSCULAR | Status: DC | PRN
  Administered 2021-06-15: 30 mg via INTRAVENOUS

## 2021-06-15 MED ORDER — MIDAZOLAM HCL 5 MG/5ML IJ SOLN
INTRAMUSCULAR | Status: DC | PRN
  Administered 2021-06-15: 2 mg via INTRAVENOUS

## 2021-06-15 MED ORDER — ONDANSETRON HCL 4 MG/2ML IJ SOLN
INTRAMUSCULAR | Status: DC | PRN
  Administered 2021-06-15: 4 mg via INTRAVENOUS

## 2021-06-15 MED ORDER — OXYCODONE HCL 5 MG PO TABS
5.0000 mg | ORAL_TABLET | Freq: Once | ORAL | Status: AC | PRN
  Administered 2021-06-15: 5 mg via ORAL

## 2021-06-15 MED ORDER — LACTATED RINGERS IV SOLN: INTRAVENOUS | Status: DC

## 2021-06-15 MED ORDER — LIDOCAINE 2% (20 MG/ML) 5 ML SYRINGE
INTRAMUSCULAR | Status: AC
  Filled 2021-06-15: qty 5

## 2021-06-15 MED ORDER — ACETAMINOPHEN 500 MG PO TABS
ORAL_TABLET | ORAL | Status: AC
  Filled 2021-06-15: qty 2

## 2021-06-15 MED ORDER — LIDOCAINE 2% (20 MG/ML) 5 ML SYRINGE
INTRAMUSCULAR | Status: DC | PRN
  Administered 2021-06-15: 60 mg via INTRAVENOUS

## 2021-06-15 MED ORDER — HYDROCODONE-ACETAMINOPHEN 5-325 MG PO TABS: 1.0000 | ORAL_TABLET | ORAL | 0 refills | Status: DC | PRN

## 2021-06-15 MED ORDER — KETOROLAC TROMETHAMINE 30 MG/ML IJ SOLN
INTRAMUSCULAR | Status: AC
  Filled 2021-06-15: qty 1

## 2021-06-15 MED ORDER — PHENYLEPHRINE 40 MCG/ML (10ML) SYRINGE FOR IV PUSH (FOR BLOOD PRESSURE SUPPORT)
PREFILLED_SYRINGE | INTRAVENOUS | Status: AC
  Filled 2021-06-15: qty 20

## 2021-06-15 MED ORDER — IOHEXOL 300 MG/ML  SOLN
INTRAMUSCULAR | Status: DC | PRN
  Administered 2021-06-15: 6 mL

## 2021-06-15 MED ORDER — PROPOFOL 10 MG/ML IV BOLUS
INTRAVENOUS | Status: DC | PRN
  Administered 2021-06-15: 200 mg via INTRAVENOUS

## 2021-06-15 MED ORDER — OXYCODONE HCL 5 MG/5ML PO SOLN: 5.0000 mg | Freq: Once | ORAL | Status: AC | PRN

## 2021-06-15 MED ORDER — PHENYLEPHRINE 40 MCG/ML (10ML) SYRINGE FOR IV PUSH (FOR BLOOD PRESSURE SUPPORT)
PREFILLED_SYRINGE | INTRAVENOUS | Status: AC
  Filled 2021-06-15: qty 10

## 2021-06-15 MED ORDER — FENTANYL CITRATE (PF) 100 MCG/2ML IJ SOLN
INTRAMUSCULAR | Status: DC | PRN
  Administered 2021-06-15: 25 ug via INTRAVENOUS
  Administered 2021-06-15: 100 ug via INTRAVENOUS
  Administered 2021-06-15: 25 ug via INTRAVENOUS
  Administered 2021-06-15: 50 ug via INTRAVENOUS

## 2021-06-15 MED ORDER — ONDANSETRON HCL 4 MG/2ML IJ SOLN
INTRAMUSCULAR | Status: AC
  Filled 2021-06-15: qty 2

## 2021-06-15 MED ORDER — OXYCODONE HCL 5 MG PO TABS
ORAL_TABLET | ORAL | Status: AC
  Filled 2021-06-15: qty 1

## 2021-06-15 MED ORDER — PROPOFOL 10 MG/ML IV BOLUS
INTRAVENOUS | Status: AC
  Filled 2021-06-15: qty 20

## 2021-06-15 MED ORDER — DEXAMETHASONE SODIUM PHOSPHATE 10 MG/ML IJ SOLN
INTRAMUSCULAR | Status: AC
  Filled 2021-06-15: qty 1

## 2021-06-15 MED ORDER — 0.9 % SODIUM CHLORIDE (POUR BTL) OPTIME
TOPICAL | Status: DC | PRN
  Administered 2021-06-15: 500 mL

## 2021-06-15 SURGICAL SUPPLY — 20 items
BAG DRAIN URO-CYSTO SKYTR STRL (DRAIN) ×3 IMPLANT
BASKET ZERO TIP NITINOL 2.4FR (BASKET) ×2 IMPLANT
CATH URET 5FR 28IN OPEN ENDED (CATHETERS) ×3 IMPLANT
CLOTH BEACON ORANGE TIMEOUT ST (SAFETY) ×3 IMPLANT
DRSG TEGADERM 2-3/8X2-3/4 SM (GAUZE/BANDAGES/DRESSINGS) ×2 IMPLANT
DRSG TEGADERM 4X4.75 (GAUZE/BANDAGES/DRESSINGS) IMPLANT
EXTRACTOR STONE 1.7FRX115CM (UROLOGICAL SUPPLIES) IMPLANT
GLOVE SURG ENC MOIS LTX SZ6.5 (GLOVE) ×3 IMPLANT
GOWN STRL REUS W/TWL LRG LVL3 (GOWN DISPOSABLE) ×3 IMPLANT
GUIDEWIRE STR DUAL SENSOR (WIRE) ×3 IMPLANT
IV NS IRRIG 3000ML ARTHROMATIC (IV SOLUTION) ×3 IMPLANT
KIT TURNOVER CYSTO (KITS) ×3 IMPLANT
MANIFOLD NEPTUNE II (INSTRUMENTS) ×3 IMPLANT
PACK CYSTO (CUSTOM PROCEDURE TRAY) ×3 IMPLANT
STENT URET 6FRX24 CONTOUR (STENTS) ×2 IMPLANT
TRACTIP FLEXIVA PULS ID 200XHI (Laser) IMPLANT
TRACTIP FLEXIVA PULSE ID 200 (Laser) ×2
TUBE CONNECTING 12'X1/4 (SUCTIONS) ×1
TUBE CONNECTING 12X1/4 (SUCTIONS) ×2 IMPLANT
TUBING UROLOGY SET (TUBING) ×3 IMPLANT

## 2021-06-15 NOTE — Interval H&P Note (Signed)
History and Physical Interval Note: Pt underwent urgent ureteral stent placement for obstructing ureteral calculus with infection. He has completed antibiotics. He now returns for ureteroscopy.   06/15/2021 12:36 PM  Tristan Hall  has presented today for surgery, with the diagnosis of RIGHT URETERAL CALCULI.  The various methods of treatment have been discussed with the patient and family. After consideration of risks, benefits and other options for treatment, the patient has consented to  Procedure(s): CYSTOSCOPY/URETEROSCOPY/HOLMIUM LASER/STENT EXCHANGE (Right) as a surgical intervention.  The patient's history has been reviewed, patient examined, no change in status, stable for surgery.  I have reviewed the patient's chart and labs.  Questions were answered to the patient's satisfaction.     Uday Jantz D Karlene Southard

## 2021-06-15 NOTE — Anesthesia Procedure Notes (Signed)
Procedure Name: LMA Insertion Date/Time: 06/15/2021 2:01 PM Performed by: Pearson Grippe, CRNA Pre-anesthesia Checklist: Patient identified, Emergency Drugs available, Suction available and Patient being monitored Patient Re-evaluated:Patient Re-evaluated prior to induction Oxygen Delivery Method: Circle system utilized Preoxygenation: Pre-oxygenation with 100% oxygen Induction Type: IV induction Ventilation: Mask ventilation without difficulty LMA: LMA inserted LMA Size: 4.0 Number of attempts: 1 Airway Equipment and Method: Bite block Placement Confirmation: positive ETCO2 Tube secured with: Tape Dental Injury: Teeth and Oropharynx as per pre-operative assessment

## 2021-06-15 NOTE — Discharge Instructions (Addendum)
DISCHARGE INSTRUCTIONS FOR KIDNEY STONE/URETERAL STENT   MEDICATIONS:  1. Resume all your other meds from home  2. AZO over the counter can help with the burning/stinging when you urinate. 3. Hydrocodone is for moderate/severe pain, otherwise taking up to 1000 mg every 6 hours of plain Tylenol will help treat your pain.   4. Take Cephalexin one hour prior to removal of your stent.    ACTIVITY:  1. No strenuous activity x 1week  2. No driving while on narcotic pain medications  3. Drink plenty of water  4. Continue to walk at home - you can still get blood clots when you are at home, so keep active, but don't over do it.  5. May return to work/school tomorrow or when you feel ready   BATHING:  1. You can shower and we recommend daily showers  2. You have a string coming from your urethra: The stent string is attached to your ureteral stent. Do not pull on this.   SIGNS/SYMPTOMS TO CALL:  Please call us if you have a fever greater than 101.5, uncontrolled nausea/vomiting, uncontrolled pain, dizziness, unable to urinate, bloody urine, chest pain, shortness of breath, leg swelling, leg pain, redness around wound, drainage from wound, or any other concerns or questions.   You can reach [place] at [phone].   FOLLOW-UP:  1. You have a string attached to your stent, you may remove it on Friday, December 16. To do this, pull the strings until the stent is completely removed. You may feel an odd sensation in your back.    Post Anesthesia Home Care Instructions  Activity: Get plenty of rest for the remainder of the day. A responsible individual must stay with you for 24 hours following the procedure.  For the next 24 hours, DO NOT: -Drive a car -Advertising copywriter -Drink alcoholic beverages -Take any medication unless instructed by your physician -Make any legal decisions or sign important papers.  Meals: Start with liquid foods such as gelatin or soup. Progress to regular foods as  tolerated. Avoid greasy, spicy, heavy foods. If nausea and/or vomiting occur, drink only clear liquids until the nausea and/or vomiting subsides. Call your physician if vomiting continues.  Special Instructions/Symptoms: Your throat may feel dry or sore from the anesthesia or the breathing tube placed in your throat during surgery. If this causes discomfort, gargle with warm salt water. The discomfort should disappear within 24 hours.  No ibuprofen/Motrin/Advil products until 830pm or after

## 2021-06-15 NOTE — Op Note (Signed)
Preoperative diagnosis: right ureteral calculus  Postoperative diagnosis: right ureteral calculus  Procedure:  Cystoscopy right ureteroscopy, laser lithotripsy, basket stone extraction right 36F x 24cm ureteral stent placement - no tether right retrograde pyelography with interpretation  Surgeon: Kasandra Knudsen, MD  Anesthesia: General  Complications: None  Intraoperative findings:  Normal urethra Bilateral lobe hypertrophy prostatic urethra Bilateral orthotropic ureteral orifices right retrograde pyelography demonstrated a filling defect within the right ureter consistent with the patients known calculus without other abnormalities. Bladder mucosa normal without masses   EBL: Minimal  Specimens: right ureteral calculus  Disposition of specimens: [facility] for stone analysis  Indication: Tristan Hall is a 45 y.o.   patient with a 4 mm right ureteral stone and associated right symptoms. After reviewing the management options for treatment, the patient elected to proceed with the above surgical procedure(s). We have discussed the potential benefits and risks of the procedure, side effects of the proposed treatment, the likelihood of the patient achieving the goals of the procedure, and any potential problems that might occur during the procedure or recuperation. Informed consent has been obtained.   Description of procedure:  The patient was taken to the operating room and general anesthesia was induced.  The patient was placed in the dorsal lithotomy position, prepped and draped in the usual sterile fashion, and preoperative antibiotics were administered. A preoperative time-out was performed.   Cystourethroscopy was performed.  The patients urethra was examined and was bilobar prostatic hypertrophy. The bladder was then systematically examined in its entirety. There was no evidence for any bladder tumors, stones, or other mucosal pathology.     Attention then turned to the right ureteral orifice and a ureteral catheter was used to intubate the ureteral orifice.  Omnipaque contrast was injected through the ureteral catheter and a retrograde pyelogram was performed with findings as dictated above.  A 0.38 sensor guidewire was then advanced up the right ureter into the renal pelvis under fluoroscopic guidance. The 6 Fr semirigid ureteroscope was then advanced into the ureter next to the guidewire and the calculus was identified.   The stone was then fragmented with the 242 micron holmium laser fiber. The large pieces were removed with a 0- tip basket. Reinspection of the ureter revealed no remaining visible stones or fragments.   The wire was then backloaded through the cystoscope and a ureteral stent was advance over the wire using Seldinger technique.  The stent was positioned appropriately under fluoroscopic and cystoscopic guidance.  The wire was then removed with an adequate stent curl noted in the renal pelvis as well as in the bladder.  The bladder was then emptied and the procedure ended.  The patient appeared to tolerate the procedure well and without complications.  The patient was able to be awakened and transferred to the recovery unit in satisfactory condition.   Disposition: The tether of the stent was left on and secured to the ventral aspect of the patient's penis.  Instructions for removing the stent have been provided to the patient.

## 2021-06-15 NOTE — Anesthesia Preprocedure Evaluation (Addendum)
Anesthesia Evaluation  Patient identified by MRN, date of birth, ID band Patient awake    Reviewed: Allergy & Precautions, NPO status , Patient's Chart, lab work & pertinent test results  Airway Mallampati: IV  TM Distance: >3 FB Neck ROM: Full    Dental  (+) Teeth Intact, Dental Advisory Given   Pulmonary neg pulmonary ROS,    Pulmonary exam normal breath sounds clear to auscultation       Cardiovascular negative cardio ROS Normal cardiovascular exam Rhythm:Regular Rate:Normal     Neuro/Psych negative neurological ROS  negative psych ROS   GI/Hepatic negative GI ROS, Neg liver ROS,   Endo/Other  diabetes, Poorly Controlled, Type 2, Oral Hypoglycemic AgentsObesity BMI 35 T2DM newly diagnosed 05/2021 FS 99 in preop  Renal/GU Renal disease (right ureteral calculi)  negative genitourinary   Musculoskeletal negative musculoskeletal ROS (+)   Abdominal (+) + obese,   Peds  Hematology negative hematology ROS (+)   Anesthesia Other Findings   Reproductive/Obstetrics negative OB ROS                            Anesthesia Physical Anesthesia Plan  ASA: 3  Anesthesia Plan: General   Post-op Pain Management: Tylenol PO (pre-op) and Toradol IV (intra-op)   Induction: Intravenous  PONV Risk Score and Plan: 2 and Ondansetron, Dexamethasone, Midazolam and Treatment may vary due to age or medical condition  Airway Management Planned: LMA  Additional Equipment: None  Intra-op Plan:   Post-operative Plan: Extubation in OR  Informed Consent: I have reviewed the patients History and Physical, chart, labs and discussed the procedure including the risks, benefits and alternatives for the proposed anesthesia with the patient or authorized representative who has indicated his/her understanding and acceptance.     Dental advisory given  Plan Discussed with: CRNA  Anesthesia Plan Comments:         Anesthesia Quick Evaluation

## 2021-06-15 NOTE — Transfer of Care (Signed)
Immediate Anesthesia Transfer of Care Note  Patient: Tzvi Economou Aguilera  Procedure(s) Performed: CYSTOSCOPY/URETEROSCOPY/HOLMIUM LASER/STENT EXCHANGE (Right: Ureter)  Patient Location: PACU  Anesthesia Type:General  Level of Consciousness: drowsy and patient cooperative  Airway & Oxygen Therapy: Patient Spontanous Breathing and Patient connected to face mask oxygen  Post-op Assessment: Report given to RN and Post -op Vital signs reviewed and stable  Post vital signs: Reviewed and stable  Last Vitals:  Vitals Value Taken Time  BP 109/63 06/15/21 1430  Temp    Pulse 63 06/15/21 1432  Resp 16 06/15/21 1432  SpO2 95 % 06/15/21 1432  Vitals shown include unvalidated device data.  Last Pain:  Vitals:   06/15/21 1301  TempSrc: Oral  PainSc: 3       Patients Stated Pain Goal: 4 (06/15/21 1301)  Complications: No notable events documented.

## 2021-06-16 ENCOUNTER — Encounter (HOSPITAL_BASED_OUTPATIENT_CLINIC_OR_DEPARTMENT_OTHER): Payer: Self-pay | Admitting: Urology

## 2021-06-16 NOTE — Anesthesia Postprocedure Evaluation (Signed)
Anesthesia Post Note  Patient: Tristan Hall  Procedure(s) Performed: CYSTOSCOPY/URETEROSCOPY/HOLMIUM LASER/STENT EXCHANGE (Right: Ureter)     Patient location during evaluation: PACU Anesthesia Type: General Level of consciousness: awake and alert, oriented and patient cooperative Pain management: pain level controlled Vital Signs Assessment: post-procedure vital signs reviewed and stable Respiratory status: spontaneous breathing, nonlabored ventilation and respiratory function stable Cardiovascular status: blood pressure returned to baseline and stable Postop Assessment: no apparent nausea or vomiting Anesthetic complications: no   No notable events documented.  Last Vitals:  Vitals:   06/15/21 1515 06/15/21 1545  BP: 106/88 (!) 121/51  Pulse: 65 (!) 48  Resp: 12 16  Temp:  36.4 C  SpO2: 95% 99%    Last Pain:  Vitals:   06/15/21 1515  TempSrc:   PainSc: 3                  Lannie Fields

## 2021-08-20 ENCOUNTER — Ambulatory Visit: Payer: Self-pay | Attending: Nurse Practitioner | Admitting: Nurse Practitioner

## 2021-08-20 ENCOUNTER — Other Ambulatory Visit: Payer: Self-pay

## 2021-08-20 ENCOUNTER — Encounter: Payer: Self-pay | Admitting: Nurse Practitioner

## 2021-08-20 VITALS — BP 104/70 | HR 64 | Ht 62.0 in | Wt 205.5 lb

## 2021-08-20 DIAGNOSIS — Z7689 Persons encountering health services in other specified circumstances: Secondary | ICD-10-CM

## 2021-08-20 DIAGNOSIS — Z1159 Encounter for screening for other viral diseases: Secondary | ICD-10-CM

## 2021-08-20 DIAGNOSIS — R7303 Prediabetes: Secondary | ICD-10-CM

## 2021-08-20 DIAGNOSIS — R7309 Other abnormal glucose: Secondary | ICD-10-CM

## 2021-08-20 DIAGNOSIS — Z114 Encounter for screening for human immunodeficiency virus [HIV]: Secondary | ICD-10-CM

## 2021-08-20 DIAGNOSIS — E785 Hyperlipidemia, unspecified: Secondary | ICD-10-CM

## 2021-08-20 MED ORDER — METFORMIN HCL 500 MG PO TABS
500.0000 mg | ORAL_TABLET | Freq: Two times a day (BID) | ORAL | 1 refills | Status: DC
Start: 2021-08-20 — End: 2021-11-17
  Filled 2021-08-20: qty 60, 30d supply, fill #0

## 2021-08-20 NOTE — Progress Notes (Signed)
Assessment & Plan:  Mowgli was seen today for establish care.  Diagnoses and all orders for this visit:  Encounter to establish care  Prediabetes -     Hemoglobin A1c -     metFORMIN (GLUCOPHAGE) 500 MG tablet; Take 1 tablet (500 mg total) by mouth 2 (two) times daily with a meal. Continue blood sugar control as discussed in office today, low carbohydrate diet, and regular physical exercise as tolerated, 150 minutes per week (30 min each day, 5 days per week, or 50 min 3 days per week). Keep blood sugar logs with fasting goal of 90-130 mg/dl, post prandial (after you eat) less than 180.  For Hypoglycemia: BS <60 and Hyperglycemia BS >400; contact the clinic ASAP. Annual eye exams and foot exams are recommended.   Dyslipidemia, goal LDL below 100 -     Lipid panel INSTRUCTIONS: Work on a low fat, heart healthy diet and participate in regular aerobic exercise program by working out at least 150 minutes per week; 5 days a week-30 minutes per day. Avoid red meat/beef/steak,  fried foods. junk foods, sodas, sugary drinks, unhealthy snacking, alcohol and smoking.  Drink at least 80 oz of water per day and monitor your carbohydrate intake daily.    Need for hepatitis C screening test -     HCV Ab w Reflex to Quant PCR -     Interpretation:  Encounter for screening for HIV -     HIV antibody (with reflex)    Patient has been counseled on age-appropriate routine health concerns for screening and prevention. These are reviewed and up-to-date. Referrals have been placed accordingly. Immunizations are up-to-date or declined.    Subjective:   Chief Complaint  Patient presents with   Establish Care   HPI Tristan Hall 46 y.o. male presents to office today to establish care He has a past medical history of DM Type 2, Hematuria, History of COVID-19 (2020), Hypercholesteremia, and Numbness (06/11/2021).   States he was just diagnosed with diabetes in the hospital while  admitted for pyelonephritis. He was started on metformin 500 mg BID at that time.  There is no A1c on file however this is an elevated glucose of 231. He is tolerating metformin as prescribed. LDL not at goal.  Lab Results  Component Value Date   HGBA1C 5.9 (H) 08/20/2021    Lab Results  Component Value Date   LDLCALC 126 (H) 08/20/2021   The 10-year ASCVD risk score (Arnett DK, et al., 2019) is: 3.5%   Values used to calculate the score:     Age: 89 years     Sex: Male     Is Non-Hispanic African American: No     Diabetic: Yes     Tobacco smoker: No     Systolic Blood Pressure: 123456 mmHg     Is BP treated: No     HDL Cholesterol: 31 mg/dL     Total Cholesterol: 184 mg/dL    Review of Systems  Constitutional:  Negative for fever, malaise/fatigue and weight loss.  HENT: Negative.  Negative for nosebleeds.   Eyes: Negative.  Negative for blurred vision, double vision and photophobia.  Respiratory: Negative.  Negative for cough and shortness of breath.   Cardiovascular: Negative.  Negative for chest pain, palpitations and leg swelling.  Gastrointestinal: Negative.  Negative for heartburn, nausea and vomiting.  Musculoskeletal: Negative.  Negative for myalgias.  Neurological: Negative.  Negative for dizziness, focal weakness, seizures and headaches.  Psychiatric/Behavioral:  Negative.  Negative for suicidal ideas.    Past Medical History:  Diagnosis Date   DM Type 2    pt does not check cbg diagnosed 05-2021 per pt   Hematuria    for last week @ times dr pace aware per pt   History of COVID-19 2020   internal fever loss of taste and smell, dizzy @ times when in sun for 2 months all symptoms resolved   Hypercholesteremia    Numbness 06/11/2021   right fingertips @ times    Past Surgical History:  Procedure Laterality Date   CYSTOSCOPY WITH RETROGRADE PYELOGRAM, URETEROSCOPY AND STENT PLACEMENT Right 05/27/2021   Procedure: [place], URETEROSCOPY  AND STENT PLACEMENT;  Surgeon: Robley Fries, MD;  Location: [facility];  Service: Urology;  Laterality: Right;   CYSTOSCOPY/URETEROSCOPY/HOLMIUM LASER/STENT PLACEMENT Right 06/15/2021   Procedure: CYSTOSCOPY/URETEROSCOPY/HOLMIUM LASER/STENT EXCHANGE;  Surgeon: Robley Fries, MD;  Location: [facility];  Service: Urology;  Laterality: Right;   LAPAROSCOPIC APPENDECTOMY N/A 08/13/2015   Procedure: APPENDECTOMY LAPAROSCOPIC;  Surgeon: Rolm Bookbinder, MD;  Location: [facility];  Service: General;  Laterality: N/A;    No family history on file.  Social History Reviewed with no changes to be made today.   Outpatient Medications Prior to Visit  Medication Sig Dispense Refill   acetaminophen (TYLENOL) 500 MG tablet Take 1,000 mg by mouth every 6 (six) hours as needed. (Patient not taking: Reported on 08/20/2021)     HYDROcodone-acetaminophen (NORCO/VICODIN) 5-325 MG tablet Take 1 tablet by mouth every 4 (four) hours as needed for moderate pain. (Patient not taking: Reported on 08/20/2021) 20 tablet 0   metFORMIN (GLUCOPHAGE) 500 MG tablet Take 1 tablet (500 mg total) by mouth 2 (two) times daily with a meal. (Patient not taking: Reported on 08/20/2021) 60 tablet 0   sulfamethoxazole-trimethoprim (BACTRIM DS) 800-160 MG tablet Take 1 tablet by mouth 2 (two) times daily. (Patient not taking: Reported on 08/20/2021) 14 tablet 0   tamsulosin (FLOMAX) 0.4 MG CAPS capsule Take 1 capsule (0.4 mg total) by mouth at bedtime. (Patient not taking: Reported on 08/20/2021) 30 capsule 0   No facility-administered medications prior to visit.    No Known Allergies     Objective:    BP 104/70    Pulse 64    Ht 5\' 2"  (1.575 m)    Wt 205 lb 8 oz (93.2 kg)    SpO2 95%    BMI 37.59 kg/m  Wt Readings from Last 3 Encounters:  08/20/21 205 lb 8 oz (93.2 kg)  06/15/21 209 lb 4.8 oz (94.9 kg)  10/02/19 200 lb (90.7 kg)    Physical Exam Vitals and nursing note reviewed.  Constitutional:      Appearance:  He is well-developed.  HENT:     Head: Normocephalic and atraumatic.  Cardiovascular:     Rate and Rhythm: Normal rate and regular rhythm.     Heart sounds: Normal heart sounds. No murmur heard.   No friction rub. No gallop.  Pulmonary:     Effort: Pulmonary effort is normal. No tachypnea or respiratory distress.     Breath sounds: Normal breath sounds. No decreased breath sounds, wheezing, rhonchi or rales.  Chest:     Chest wall: No tenderness.  Abdominal:     General: Bowel sounds are normal.     Palpations: Abdomen is soft.  Musculoskeletal:        General: Normal range of motion.     Cervical  back: Normal range of motion.  Skin:    General: Skin is warm and dry.  Neurological:     Mental Status: He is alert and oriented to person, place, and time.     Coordination: Coordination normal.  Psychiatric:        Behavior: Behavior normal. Behavior is cooperative.        Thought Content: Thought content normal.        Judgment: Judgment normal.         Patient has been counseled extensively about nutrition and exercise as well as the importance of adherence with medications and regular follow-up. The patient was given clear instructions to go to ER or return to medical center if symptoms don't improve, worsen or new problems develop. The patient verbalized understanding.   Follow-up: Return in about 3 months (around 11/17/2021).   Gildardo Pounds, FNP-BC [facility] [place], [place]   08/25/2021, 2:20 PM

## 2021-08-21 LAB — LIPID PANEL
Chol/HDL Ratio: 5.9 ratio — ABNORMAL HIGH (ref 0.0–5.0)
Cholesterol, Total: 184 mg/dL (ref 100–199)
HDL: 31 mg/dL — ABNORMAL LOW (ref >39)
LDL Chol Calc (NIH): 126 mg/dL — ABNORMAL HIGH (ref 0–99)
Triglycerides: 148 mg/dL (ref 0–149)
VLDL Cholesterol Cal: 27 mg/dL (ref 5–40)

## 2021-08-21 LAB — HEMOGLOBIN A1C
Est. average glucose Bld gHb Est-mCnc: 123 mg/dL
Hgb A1c MFr Bld: 5.9 % — ABNORMAL HIGH (ref 4.8–5.6)

## 2021-08-21 LAB — HIV ANTIBODY (ROUTINE TESTING W REFLEX): HIV Screen 4th Generation wRfx: NONREACTIVE

## 2021-08-21 LAB — HCV INTERPRETATION

## 2021-08-21 LAB — HCV AB W REFLEX TO QUANT PCR: HCV Ab: NONREACTIVE

## 2021-08-23 ENCOUNTER — Telehealth: Payer: Self-pay

## 2021-08-23 NOTE — Telephone Encounter (Signed)
-----   Message from Claiborne Rigg, NP sent at 08/23/2021  8:49 AM EST ----- A1c in prediabetes range. Work on diet. Low carbs, more fruits and vegetables. Less breads, sweets, pasta, sugars.   HEP C/HIV negative  Cholesterol levels slightly elevated. Focus on low fat low cholesterol foods.

## 2021-08-23 NOTE — Telephone Encounter (Signed)
Called patient reviewed all information and repeated back to me. Will call if any questions.  °With help from interpretor Jerker # [id number] °

## 2021-08-25 ENCOUNTER — Encounter: Payer: Self-pay | Admitting: Nurse Practitioner

## 2021-11-17 ENCOUNTER — Ambulatory Visit: Payer: Self-pay | Attending: Nurse Practitioner | Admitting: Nurse Practitioner

## 2021-11-17 ENCOUNTER — Encounter: Payer: Self-pay | Admitting: Nurse Practitioner

## 2021-11-17 ENCOUNTER — Other Ambulatory Visit: Payer: Self-pay

## 2021-11-17 VITALS — BP 120/78 | HR 64 | Temp 98.2°F | Wt 215.0 lb

## 2021-11-17 DIAGNOSIS — E785 Hyperlipidemia, unspecified: Secondary | ICD-10-CM

## 2021-11-17 DIAGNOSIS — D72829 Elevated white blood cell count, unspecified: Secondary | ICD-10-CM

## 2021-11-17 DIAGNOSIS — R7303 Prediabetes: Secondary | ICD-10-CM

## 2021-11-17 MED ORDER — METFORMIN HCL 500 MG PO TABS
250.0000 mg | ORAL_TABLET | Freq: Every day | ORAL | 1 refills | Status: DC
  Filled 2021-11-17: qty 90, 180d supply, fill #0

## 2021-11-17 MED ORDER — METFORMIN HCL 500 MG PO TABS
250.0000 mg | ORAL_TABLET | Freq: Two times a day (BID) | ORAL | 1 refills | Status: DC
  Filled 2021-11-17: qty 90, 90d supply, fill #0

## 2021-11-17 NOTE — Patient Instructions (Signed)
You can take omega 3 or fish oil daily for your cholesterol ?

## 2021-11-17 NOTE — Progress Notes (Signed)
? ?Assessment & Plan:  ?Tristan Hall was seen today for prediabetes. ? ?Diagnoses and all orders for this visit: ? ?Prediabetes ?-     CMP14+EGFR ?-     metFORMIN (GLUCOPHAGE) 500 MG tablet; Take 0.5 tablets (250 mg total) by mouth daily with breakfast. ? ?Dyslipidemia, goal LDL below 70 ?LDL not at goal.  I have recommended omega-3 over-the-counter 1 to 2 capsules daily. ?-     Lipid panel ? ?Leukocytosis, unspecified type ?-     CBC with Differential ? ? ? ?Patient has been counseled on age-appropriate routine health concerns for screening and prevention. These are reviewed and up-to-date. Referrals have been placed accordingly. Immunizations are up-to-date or declined.    ?Subjective:  ? ?Chief Complaint  ?Patient presents with  ? Prediabetes  ? ?HPI ?Tristan Hall 46 y.o. male presents to office today for follow-up to prediabetes. ? ?He stopped taking metformin 500 mg twice daily due to significant GI upset with loose stools.  At this time we will try him on 250 mg once daily. ?Lab Results  ?Component Value Date  ? HGBA1C 5.9 (H) 08/20/2021  ?  ?Lab Results  ?Component Value Date  ? LDLCALC 126 (H) 08/20/2021  ?  ? ?Dyslipidemia ?The 10-year ASCVD risk score (Arnett DK, et al., 2019) is: 4.9% ?  Values used to calculate the score: ?    Age: 22 years ?    Sex: Male ?    Is Non-Hispanic African American: No ?    Diabetic: Yes ?    Tobacco smoker: No ?    Systolic Blood Pressure: 570 mmHg ?    Is BP treated: No ?    HDL Cholesterol: 31 mg/dL ?    Total Cholesterol: 184 mg/dL  ? ?Review of Systems  ?Constitutional:  Negative for fever, malaise/fatigue and weight loss.  ?HENT: Negative.  Negative for nosebleeds.   ?Eyes: Negative.  Negative for blurred vision, double vision and photophobia.  ?Respiratory: Negative.  Negative for cough and shortness of breath.   ?Cardiovascular: Negative.  Negative for chest pain, palpitations and leg swelling.  ?Gastrointestinal: Negative.  Negative for heartburn, nausea  and vomiting.  ?Musculoskeletal: Negative.  Negative for myalgias.  ?Neurological: Negative.  Negative for dizziness, focal weakness, seizures and headaches.  ?Psychiatric/Behavioral: Negative.  Negative for suicidal ideas.   ? ?Past Medical History:  ?Diagnosis Date  ? DM Type 2   ? pt does not check cbg diagnosed 05-2021 per pt  ? Hematuria   ? for last week @ times dr pace aware per pt  ? History of COVID-19 2020  ? internal fever loss of taste and smell, dizzy @ times when in sun for 2 months all symptoms resolved  ? Hypercholesteremia   ? Numbness 06/11/2021  ? right fingertips @ times  ? ? ?Past Surgical History:  ?Procedure Laterality Date  ? CYSTOSCOPY WITH RETROGRADE PYELOGRAM, URETEROSCOPY AND STENT PLACEMENT Right 05/27/2021  ? Procedure: CYSTOSCOPY WITH RETROGRADE PYELOGRAM, URETEROSCOPY AND STENT PLACEMENT;  Surgeon: Robley Fries, MD;  Location: [facility];  Service: Urology;  Laterality: Right;  ? CYSTOSCOPY/URETEROSCOPY/HOLMIUM LASER/STENT PLACEMENT Right 06/15/2021  ? Procedure: CYSTOSCOPY/URETEROSCOPY/HOLMIUM LASER/STENT EXCHANGE;  Surgeon: Robley Fries, MD;  Location: [facility];  Service: Urology;  Laterality: Right;  ? LAPAROSCOPIC APPENDECTOMY N/A 08/13/2015  ? Procedure: APPENDECTOMY LAPAROSCOPIC;  Surgeon: Rolm Bookbinder, MD;  Location: [facility];  Service: General;  Laterality: N/A;  ? ? ?History reviewed. No pertinent family history. ? ?Social History  Reviewed with no changes to be made today.  ? ?Outpatient Medications Prior to Visit  ?Medication Sig Dispense Refill  ? metFORMIN (GLUCOPHAGE) 500 MG tablet Take 1 tablet (500 mg total) by mouth 2 (two) times daily with a meal. (Patient not taking: Reported on 11/17/2021) 180 tablet 1  ? ?No facility-administered medications prior to visit.  ? ? ?No Known Allergies ? ?   ?Objective:  ?  ?BP 120/78   Pulse 64   Temp 98.2 ?F (36.8 ?C) (Oral)   Wt 215 lb (97.5 kg)   SpO2 95%   BMI 39.32 kg/m?  ?Wt Readings from Last 3  Encounters:  ?11/17/21 215 lb (97.5 kg)  ?08/20/21 205 lb 8 oz (93.2 kg)  ?06/15/21 209 lb 4.8 oz (94.9 kg)  ? ? ?Physical Exam ?Vitals and nursing note reviewed.  ?Constitutional:   ?   Appearance: He is well-developed.  ?HENT:  ?   Head: Normocephalic and atraumatic.  ?Cardiovascular:  ?   Rate and Rhythm: Normal rate and regular rhythm.  ?   Heart sounds: Normal heart sounds. No murmur heard. ?  No friction rub. No gallop.  ?Pulmonary:  ?   Effort: Pulmonary effort is normal. No tachypnea or respiratory distress.  ?   Breath sounds: Normal breath sounds. No decreased breath sounds, wheezing, rhonchi or rales.  ?Chest:  ?   Chest wall: No tenderness.  ?Abdominal:  ?   General: Bowel sounds are normal.  ?   Palpations: Abdomen is soft.  ?Musculoskeletal:     ?   General: Normal range of motion.  ?   Cervical back: Normal range of motion.  ?Skin: ?   General: Skin is warm and dry.  ?Neurological:  ?   Mental Status: He is alert and oriented to person, place, and time.  ?   Coordination: Coordination normal.  ?Psychiatric:     ?   Behavior: Behavior normal. Behavior is cooperative.     ?   Thought Content: Thought content normal.     ?   Judgment: Judgment normal.  ? ? ? ? ?   ?Patient has been counseled extensively about nutrition and exercise as well as the importance of adherence with medications and regular follow-up. The patient was given clear instructions to go to ER or return to medical center if symptoms don't improve, worsen or new problems develop. The patient verbalized understanding.  ? ?Follow-up: Return in about 14 weeks (around 02/23/2022).  ? ?Gildardo Pounds, FNP-BC ?[place] ?[place], [place] ?[phone]   ?11/17/2021, 2:20 PM ?

## 2021-11-17 NOTE — Progress Notes (Signed)
?  Has not taken medication 2 weeks causing diarrhea.  ? ? ?[place]  [id number] ?

## 2021-11-18 LAB — CMP14+EGFR
ALT: 25 IU/L (ref 0–44)
AST: 22 IU/L (ref 0–40)
Albumin/Globulin Ratio: 1.5 (ref 1.2–2.2)
Albumin: 4.5 g/dL (ref 4.0–5.0)
Alkaline Phosphatase: 88 IU/L (ref 44–121)
BUN/Creatinine Ratio: 19 (ref 9–20)
BUN: 16 mg/dL (ref 6–24)
Bilirubin Total: 0.2 mg/dL (ref 0.0–1.2)
CO2: 22 mmol/L (ref 20–29)
Calcium: 9.7 mg/dL (ref 8.7–10.2)
Chloride: 104 mmol/L (ref 96–106)
Creatinine, Ser: 0.85 mg/dL (ref 0.76–1.27)
Globulin, Total: 3 g/dL (ref 1.5–4.5)
Glucose: 104 mg/dL — ABNORMAL HIGH (ref 70–99)
Potassium: 4.4 mmol/L (ref 3.5–5.2)
Sodium: 141 mmol/L (ref 134–144)
Total Protein: 7.5 g/dL (ref 6.0–8.5)
eGFR: 110 mL/min/{1.73_m2} (ref >59)

## 2021-11-18 LAB — LIPID PANEL
Chol/HDL Ratio: 6 ratio — ABNORMAL HIGH (ref 0.0–5.0)
Cholesterol, Total: 198 mg/dL (ref 100–199)
HDL: 33 mg/dL — ABNORMAL LOW (ref >39)
LDL Chol Calc (NIH): 120 mg/dL — ABNORMAL HIGH (ref 0–99)
Triglycerides: 255 mg/dL — ABNORMAL HIGH (ref 0–149)
VLDL Cholesterol Cal: 45 mg/dL — ABNORMAL HIGH (ref 5–40)

## 2021-11-18 LAB — CBC WITH DIFFERENTIAL/PLATELET
Basophils Absolute: 0.1 10*3/uL (ref 0.0–0.2)
Basos: 1 %
EOS (ABSOLUTE): 0.2 10*3/uL (ref 0.0–0.4)
Eos: 3 %
Hematocrit: 46.1 % (ref 37.5–51.0)
Hemoglobin: 15.4 g/dL (ref 13.0–17.7)
Immature Grans (Abs): 0 10*3/uL (ref 0.0–0.1)
Immature Granulocytes: 0 %
Lymphocytes Absolute: 2.9 10*3/uL (ref 0.7–3.1)
Lymphs: 44 %
MCH: 28.9 pg (ref 26.6–33.0)
MCHC: 33.4 g/dL (ref 31.5–35.7)
MCV: 87 fL (ref 79–97)
Monocytes Absolute: 0.5 10*3/uL (ref 0.1–0.9)
Monocytes: 8 %
Neutrophils Absolute: 2.9 10*3/uL (ref 1.4–7.0)
Neutrophils: 44 %
Platelets: 230 10*3/uL (ref 150–450)
RBC: 5.33 x10E6/uL (ref 4.14–5.80)
RDW: 13.2 % (ref 11.6–15.4)
WBC: 6.6 10*3/uL (ref 3.4–10.8)

## 2022-02-25 ENCOUNTER — Ambulatory Visit: Payer: Self-pay | Attending: Nurse Practitioner | Admitting: Nurse Practitioner

## 2022-02-25 ENCOUNTER — Encounter: Payer: Self-pay | Admitting: Nurse Practitioner

## 2022-02-25 ENCOUNTER — Other Ambulatory Visit: Payer: Self-pay

## 2022-02-25 VITALS — BP 128/82 | HR 51 | Temp 98.3°F | Resp 16 | Ht 62.0 in | Wt 216.0 lb

## 2022-02-25 DIAGNOSIS — E1165 Type 2 diabetes mellitus with hyperglycemia: Secondary | ICD-10-CM

## 2022-02-25 LAB — POCT GLYCOSYLATED HEMOGLOBIN (HGB A1C): HbA1c, POC (controlled diabetic range): 6.6 % (ref 0.0–7.0)

## 2022-02-25 MED ORDER — GLIMEPIRIDE 1 MG PO TABS
1.0000 mg | ORAL_TABLET | Freq: Every day | ORAL | 1 refills | Status: DC
Start: 2022-02-25 — End: 2022-06-01
  Filled 2022-02-25: qty 30, 30d supply, fill #0

## 2022-02-25 NOTE — Progress Notes (Signed)
Assessment & Plan:  Deivi was seen today for diabetes.  Diagnoses and all orders for this visit:  Type 2 diabetes mellitus with hyperglycemia, without long-term current use of insulin (HCC) -     glimepiride (AMARYL) 1 MG tablet; Take 1 tablet (1 mg total) by mouth daily with breakfast. -     Basic metabolic panel -     HgB A1c Continue blood sugar control as discussed in office today, low carbohydrate diet, and regular physical exercise as tolerated, 150 minutes per week (30 min each day, 5 days per week, or 50 min 3 days per week). Keep blood sugar logs with fasting goal of 90-130 mg/dl, post prandial (after you eat) less than 180.  For Hypoglycemia: BS <60 and Hyperglycemia BS >400; contact the clinic ASAP.     Patient has been counseled on age-appropriate routine health concerns for screening and prevention. These are reviewed and up-to-date. Referrals have been placed accordingly. Immunizations are up-to-date or declined.    Subjective:   Chief Complaint  Patient presents with   Diabetes    A1C checkup, hasn't been checking blood sugars regularly,`   HPI Claudell Wohler Aguilera 46 y.o. male presents to office today for follow up to DM2   DM 2 He reports GI upset with diarrhea taking metformin 250 mg daily.  We will start glimepiride 1 mg daily and his A1c has increased from 5.9-6.6 today.  He is not dietary or exercise adherence.  BMI 39.5 he denies any symptoms or complications from [facility] of hypo or hyperglycemia Lab Results  Component Value Date   HGBA1C 6.6 02/25/2022    Lab Results  Component Value Date   HGBA1C 5.9 (H) 08/20/2021  LDL not at goal.  We will start low-dose statin Lab Results  Component Value Date   LDLCALC 120 (H) 11/17/2021     Review of Systems  Constitutional:  Negative for fever, malaise/fatigue and weight loss.  HENT: Negative.  Negative for nosebleeds.   Eyes: Negative.  Negative for blurred vision, double vision and photophobia.   Respiratory: Negative.  Negative for cough and shortness of breath.   Cardiovascular: Negative.  Negative for chest pain, palpitations and leg swelling.  Gastrointestinal: Negative.  Negative for heartburn, nausea and vomiting.  Musculoskeletal: Negative.  Negative for myalgias.  Neurological: Negative.  Negative for dizziness, focal weakness, seizures and headaches.  Psychiatric/Behavioral: Negative.  Negative for suicidal ideas.     Past Medical History:  Diagnosis Date   DM Type 2    pt does not check cbg diagnosed 05-2021 per pt   Hematuria    for last week @ times dr pace aware per pt   History of COVID-19 2020   internal fever loss of taste and smell, dizzy @ times when in sun for 2 months all symptoms resolved   Hypercholesteremia    Numbness 06/11/2021   right fingertips @ times    Past Surgical History:  Procedure Laterality Date   CYSTOSCOPY WITH RETROGRADE PYELOGRAM, URETEROSCOPY AND STENT PLACEMENT Right 05/27/2021   Procedure: CYSTOSCOPY WITH RETROGRADE PYELOGRAM, URETEROSCOPY AND STENT PLACEMENT;  Surgeon: Noel Christmas, MD;  Location: [facility] OR;  Service: Urology;  Laterality: Right;   CYSTOSCOPY/URETEROSCOPY/HOLMIUM LASER/STENT PLACEMENT Right 06/15/2021   Procedure: CYSTOSCOPY/URETEROSCOPY/HOLMIUM LASER/STENT EXCHANGE;  Surgeon: Noel Christmas, MD;  Location: [facility];  Service: Urology;  Laterality: Right;   LAPAROSCOPIC APPENDECTOMY N/A 08/13/2015   Procedure: APPENDECTOMY LAPAROSCOPIC;  Surgeon: Emelia Loron, MD;  Location: [facility] OR;  Service: General;  Laterality: N/A;    History reviewed. No pertinent family history.  Social History Reviewed with no changes to be made today.   Outpatient Medications Prior to Visit  Medication Sig Dispense Refill   metFORMIN (GLUCOPHAGE) 500 MG tablet Take 0.5 tablets (250 mg total) by mouth daily with breakfast. 90 tablet 1   No facility-administered medications prior to visit.    No Known  Allergies     Objective:    BP 128/82 (BP Location: Left Arm, Patient Position: Sitting, Cuff Size: Large)   Pulse (!) 51   Temp 98.3 F (36.8 C)   Resp 16   Ht 5\' 2"  (1.575 m)   Wt 216 lb (98 kg)   SpO2 94%   BMI 39.51 kg/m  Wt Readings from Last 3 Encounters:  02/25/22 216 lb (98 kg)  11/17/21 215 lb (97.5 kg)  08/20/21 205 lb 8 oz (93.2 kg)    Physical Exam Vitals and nursing note reviewed.  Constitutional:      Appearance: He is well-developed.  HENT:     Head: Normocephalic and atraumatic.  Cardiovascular:     Rate and Rhythm: Normal rate and regular rhythm.     Heart sounds: Normal heart sounds. No murmur heard.    No friction rub. No gallop.  Pulmonary:     Effort: Pulmonary effort is normal. No tachypnea or respiratory distress.     Breath sounds: Normal breath sounds. No decreased breath sounds, wheezing, rhonchi or rales.  Chest:     Chest wall: No tenderness.  Abdominal:     General: Bowel sounds are normal.     Palpations: Abdomen is soft.  Musculoskeletal:        General: Normal range of motion.     Cervical back: Normal range of motion.  Skin:    General: Skin is warm and dry.  Neurological:     Mental Status: He is alert and oriented to person, place, and time.     Coordination: Coordination normal.  Psychiatric:        Behavior: Behavior normal. Behavior is cooperative.        Thought Content: Thought content normal.        Judgment: Judgment normal.          Patient has been counseled extensively about nutrition and exercise as well as the importance of adherence with medications and regular follow-up. The patient was given clear instructions to go to ER or return to medical center if symptoms don't improve, worsen or new problems develop. The patient verbalized understanding.   Follow-up: Return in about 3 months (around 05/28/2022) for DM2.   05/30/2022, FNP-BC [facility] and [facility] [place],  [place]   02/25/2022, 3:02 PM

## 2022-02-26 LAB — BASIC METABOLIC PANEL
BUN/Creatinine Ratio: 14 (ref 9–20)
BUN: 13 mg/dL (ref 6–24)
CO2: 22 mmol/L (ref 20–29)
Calcium: 9.6 mg/dL (ref 8.7–10.2)
Chloride: 103 mmol/L (ref 96–106)
Creatinine, Ser: 0.94 mg/dL (ref 0.76–1.27)
Glucose: 120 mg/dL — ABNORMAL HIGH (ref 70–99)
Potassium: 4.8 mmol/L (ref 3.5–5.2)
Sodium: 141 mmol/L (ref 134–144)
eGFR: 103 mL/min/{1.73_m2} (ref >59)

## 2022-06-01 ENCOUNTER — Ambulatory Visit: Payer: Self-pay | Attending: Nurse Practitioner | Admitting: Nurse Practitioner

## 2022-06-01 ENCOUNTER — Encounter: Payer: Self-pay | Admitting: Nurse Practitioner

## 2022-06-01 ENCOUNTER — Other Ambulatory Visit: Payer: Self-pay

## 2022-06-01 VITALS — BP 111/71 | HR 60 | Temp 98.0°F | Ht 62.0 in | Wt 222.8 lb

## 2022-06-01 DIAGNOSIS — Z23 Encounter for immunization: Secondary | ICD-10-CM

## 2022-06-01 DIAGNOSIS — E1165 Type 2 diabetes mellitus with hyperglycemia: Secondary | ICD-10-CM

## 2022-06-01 LAB — POCT GLYCOSYLATED HEMOGLOBIN (HGB A1C): HbA1c, POC (controlled diabetic range): 6.2 % (ref 0.0–7.0)

## 2022-06-01 MED ORDER — GLIMEPIRIDE 1 MG PO TABS
1.0000 mg | ORAL_TABLET | Freq: Every day | ORAL | 1 refills | Status: DC
  Filled 2022-06-01: qty 90, 90d supply, fill #0

## 2022-06-01 NOTE — Progress Notes (Signed)
Assessment & Plan:  Tristan Hall was seen today for diabetes.  Diagnoses and all orders for this visit:  Type 2 diabetes mellitus with hyperglycemia, without long-term current use of insulin (HCC) -     HgB A1c -     glimepiride (AMARYL) 1 MG tablet; Take 1 tablet (1 mg total) by mouth daily with breakfast. -     CMP14+EGFR Continue blood sugar control as discussed in office today, low carbohydrate diet, and regular physical exercise as tolerated, 150 minutes per week (30 min each day, 5 days per week, or 50 min 3 days per week). Keep blood sugar logs with fasting goal of 90-130 mg/dl, post prandial (after you eat) less than 180.  For Hypoglycemia: BS <60 and Hyperglycemia BS >400; contact the clinic ASAP. Annual eye exams and foot exams are recommended.   Need for influenza vaccination -     Flu Vaccine MDCK QUAD PF    Patient has been counseled on age-appropriate routine health concerns for screening and prevention. These are reviewed and up-to-date. Referrals have been placed accordingly. Immunizations are up-to-date or declined.    Subjective:   Chief Complaint  Patient presents with   Diabetes   HPI Tristan Hall 46 y.o. male presents to office today for follow up to diabetes.   DM 2 Well controlled with glimepiride 1 mg daily. He does have concerns about his weight. We discussed low carb, low fat, low cholesterol diet.  Lab Results  Component Value Date   HGBA1C 6.2 06/01/2022    Lab Results  Component Value Date   HGBA1C 6.6 02/25/2022  Blood pressure is well controlled BP Readings from Last 3 Encounters:  06/01/22 111/71  02/25/22 128/82  11/17/21 120/78     Dyslipidemia The 10-year ASCVD risk score (Arnett DK, et al., 2019) is: 4.9%   Values used to calculate the score:     Age: 46 years     Sex: Male     Is Non-Hispanic African American: No     Diabetic: Yes     Tobacco smoker: No     Systolic Blood Pressure: 833 mmHg     Is BP treated: No      HDL Cholesterol: 31 mg/dL     Total Cholesterol: 184 mg/dL Review of Systems  Constitutional:  Negative for fever, malaise/fatigue and weight loss.  HENT: Negative.  Negative for nosebleeds.   Eyes: Negative.  Negative for blurred vision, double vision and photophobia.  Respiratory: Negative.  Negative for cough and shortness of breath.   Cardiovascular: Negative.  Negative for chest pain, palpitations and leg swelling.  Gastrointestinal: Negative.  Negative for heartburn, nausea and vomiting.  Musculoskeletal: Negative.  Negative for myalgias.  Neurological: Negative.  Negative for dizziness, focal weakness, seizures and headaches.  Psychiatric/Behavioral: Negative.  Negative for suicidal ideas.     Past Medical History:  Diagnosis Date   DM Type 2    pt does not check cbg diagnosed 05-2021 per pt   Hematuria    for last week @ times dr pace aware per pt   History of COVID-19 2020   internal fever loss of taste and smell, dizzy @ times when in sun for 2 months all symptoms resolved   Hypercholesteremia    Numbness 06/11/2021   right fingertips @ times    Past Surgical History:  Procedure Laterality Date   CYSTOSCOPY WITH RETROGRADE PYELOGRAM, URETEROSCOPY AND STENT PLACEMENT Right 05/27/2021   Procedure: CYSTOSCOPY WITH RETROGRADE PYELOGRAM, URETEROSCOPY  AND STENT PLACEMENT;  Surgeon: Robley Fries, MD;  Location: [facility];  Service: Urology;  Laterality: Right;   CYSTOSCOPY/URETEROSCOPY/HOLMIUM LASER/STENT PLACEMENT Right 06/15/2021   Procedure: CYSTOSCOPY/URETEROSCOPY/HOLMIUM LASER/STENT EXCHANGE;  Surgeon: Robley Fries, MD;  Location[facility];  Service: Urology;  Laterality: Right;   LAPAROSCOPIC APPENDECTOMY N/A 08/13/2015   Procedure: APPENDECTOMY LAPAROSCOPIC;  Surgeon: Rolm Bookbinder, MD;  Location: [facility];  Service: General;  Laterality: N/A;    History reviewed. No pertinent family history.  Social History Reviewed with no changes to be  made today.   Outpatient Medications Prior to Visit  Medication Sig Dispense Refill   glimepiride (AMARYL) 1 MG tablet Take 1 tablet (1 mg total) by mouth daily with breakfast. 30 tablet 1   No facility-administered medications prior to visit.    No Known Allergies     Objective:    BP 111/71   Pulse 60   Temp 98 F (36.7 C) (Temporal)   Ht _0  (1.575 m)   Wt 222 lb 12.8 oz (101.1 kg)   SpO2 95%   BMI 40.75 kg/m  Wt Readings from Last 3 Encounters:  06/01/22 222 lb 12.8 oz (101.1 kg)  02/25/22 216 lb (98 kg)  11/17/21 215 lb (97.5 kg)    Physical Exam Vitals and nursing note reviewed.  Constitutional:      Appearance: He is well-developed.  HENT:     Head: Normocephalic and atraumatic.  Cardiovascular:     Rate and Rhythm: Normal rate and regular rhythm.     Heart sounds: Normal heart sounds. No murmur heard.    No friction rub. No gallop.  Pulmonary:     Effort: Pulmonary effort is normal. No tachypnea or respiratory distress.     Breath sounds: Normal breath sounds. No decreased breath sounds, wheezing, rhonchi or rales.  Chest:     Chest wall: No tenderness.  Abdominal:     General: Bowel sounds are normal.     Palpations: Abdomen is soft.  Musculoskeletal:        General: Normal range of motion.     Cervical back: Normal range of motion.  Skin:    General: Skin is warm and dry.  Neurological:     Mental Status: He is alert and oriented to person, place, and time.     Coordination: Coordination normal.  Psychiatric:        Behavior: Behavior normal. Behavior is cooperative.        Thought Content: Thought content normal.        Judgment: Judgment normal.          Patient has been counseled extensively about nutrition and exercise as well as the importance of adherence with medications and regular follow-up. The patient was given clear instructions to go to ER or return to medical center if symptoms don't improve, worsen or new problems develop.  The patient verbalized understanding.   Follow-up: Return in about 6 months (around 11/30/2022) for dm.   Gildardo Pounds, FNP-BC [facility] and [facility] [place], [place]   06/01/2022, 11:34 AM

## 2022-06-02 LAB — CMP14+EGFR
ALT: 35 IU/L (ref 0–44)
AST: 27 IU/L (ref 0–40)
Albumin/Globulin Ratio: 1.6 (ref 1.2–2.2)
Albumin: 4.8 g/dL (ref 4.1–5.1)
Alkaline Phosphatase: 80 IU/L (ref 44–121)
BUN/Creatinine Ratio: 16 (ref 9–20)
BUN: 15 mg/dL (ref 6–24)
Bilirubin Total: 0.4 mg/dL (ref 0.0–1.2)
CO2: 25 mmol/L (ref 20–29)
Calcium: 10.2 mg/dL (ref 8.7–10.2)
Chloride: 98 mmol/L (ref 96–106)
Creatinine, Ser: 0.93 mg/dL (ref 0.76–1.27)
Globulin, Total: 3 g/dL (ref 1.5–4.5)
Glucose: 113 mg/dL — ABNORMAL HIGH (ref 70–99)
Potassium: 4.9 mmol/L (ref 3.5–5.2)
Sodium: 138 mmol/L (ref 134–144)
Total Protein: 7.8 g/dL (ref 6.0–8.5)
eGFR: 104 mL/min/{1.73_m2} (ref >59)

## 2022-11-30 ENCOUNTER — Ambulatory Visit: Payer: Self-pay | Admitting: Nurse Practitioner

## 2023-03-08 ENCOUNTER — Encounter: Payer: Self-pay | Admitting: Nurse Practitioner

## 2023-03-08 ENCOUNTER — Ambulatory Visit: Payer: Self-pay | Attending: Nurse Practitioner | Admitting: Nurse Practitioner

## 2023-03-08 ENCOUNTER — Other Ambulatory Visit: Payer: Self-pay

## 2023-03-08 VITALS — BP 134/75 | HR 70 | Ht 62.0 in | Wt 221.2 lb

## 2023-03-08 DIAGNOSIS — Z1211 Encounter for screening for malignant neoplasm of colon: Secondary | ICD-10-CM

## 2023-03-08 DIAGNOSIS — Z7984 Long term (current) use of oral hypoglycemic drugs: Secondary | ICD-10-CM

## 2023-03-08 DIAGNOSIS — E1165 Type 2 diabetes mellitus with hyperglycemia: Secondary | ICD-10-CM

## 2023-03-08 DIAGNOSIS — N529 Male erectile dysfunction, unspecified: Secondary | ICD-10-CM

## 2023-03-08 LAB — POCT GLYCOSYLATED HEMOGLOBIN (HGB A1C): Hemoglobin A1C: 8.5 % — AB (ref 4.0–5.6)

## 2023-03-08 MED ORDER — TRUE METRIX METER W/DEVICE KIT
PACK | 0 refills | Status: AC
Start: 2023-03-08 — End: ?
  Filled 2023-03-08: qty 1, 30d supply, fill #0

## 2023-03-08 MED ORDER — TRUEPLUS LANCETS 28G MISC
3 refills | Status: DC
  Filled 2023-03-08: qty 100, 50d supply, fill #0
  Filled 2023-05-16: qty 100, 50d supply, fill #1
  Filled 2023-08-10: qty 100, 50d supply, fill #2
  Filled 2023-12-27: qty 100, 50d supply, fill #3

## 2023-03-08 MED ORDER — TRUE METRIX BLOOD GLUCOSE TEST VI STRP
ORAL_STRIP | 12 refills | Status: DC
Start: 2023-03-08 — End: 2024-04-26
  Filled 2023-03-08: qty 100, 50d supply, fill #0
  Filled 2023-05-16: qty 100, 50d supply, fill #1
  Filled 2023-08-10: qty 100, 50d supply, fill #2
  Filled 2023-12-27: qty 100, 50d supply, fill #3

## 2023-03-08 MED ORDER — GLIMEPIRIDE 2 MG PO TABS
2.0000 mg | ORAL_TABLET | Freq: Every day | ORAL | 1 refills | Status: DC
  Filled 2023-03-08: qty 90, 90d supply, fill #0
  Filled 2023-05-16 (×2): qty 90, 90d supply, fill #1

## 2023-03-08 MED ORDER — SILDENAFIL CITRATE 100 MG PO TABS
50.0000 mg | ORAL_TABLET | Freq: Every day | ORAL | 11 refills | Status: DC | PRN
  Filled 2023-03-08: qty 5, 15d supply, fill #0

## 2023-03-08 NOTE — Progress Notes (Signed)
Assessment & Plan:  Naadir was seen today for medical management of chronic issues and erectile dysfunction.  Diagnoses and all orders for this visit:  Type 2 diabetes mellitus with hyperglycemia, without long-term current use of insulin (HCC) -     Urine Albumin/Creatinine with ratio (send out) [LAB689] -     POCT glycosylated hemoglobin (Hb A1C) -     glimepiride (AMARYL) 2 MG tablet; Take 1 tablet (2 mg total) by mouth daily with breakfast. -     Blood Glucose Monitoring Suppl (TRUE METRIX METER) w/Device KIT; Use as instructed. Check blood glucose level by fingerstick twice per day. -     glucose blood (TRUE METRIX BLOOD GLUCOSE TEST) test strip; Use as instructed. Check blood glucose level by fingerstick twice per day. -     TRUEplus Lancets 28G MISC; Use as instructed. Check blood glucose level by fingerstick twice per day. -     CMP14+EGFR  Colon cancer screening -     Fecal occult blood, imunochemical([facility])  Erectile dysfunction, unspecified erectile dysfunction type -     sildenafil (VIAGRA) 100 MG tablet; Take 0.5-1 tablets (50-100 mg total) by mouth daily as needed for erectile dysfunction.    Patient has been counseled on age-appropriate routine health concerns for screening and prevention. These are reviewed and up-to-date. Referrals have been placed accordingly. Immunizations are up-to-date or declined.    Subjective:   Chief Complaint  Patient presents with   Medical Management of Chronic Issues   Erectile Dysfunction   HPI Tristan Hall 47 y.o. male presents to office today   VRI was used to communicate directly with patient for the entire encounter including providing detailed patient instructions.    DM 2 A1c is not at goal of <7. He has not been taking glimepiride 1mg  daily as prescribed. Does not monitor his blood glucose levels at home twice per day. Weight is stable. Lab Results  Component Value Date   HGBA1C 8.5 (A)  03/08/2023    Erectile Dysfunction He has difficulty maintaining an erection but not achieving one. He also loses erection prior to ejaculation. He does wake up with morning erections. Denies any loss of libido.   Review of Systems  Constitutional:  Negative for fever, malaise/fatigue and weight loss.  HENT: Negative.  Negative for nosebleeds.   Eyes: Negative.  Negative for blurred vision, double vision and photophobia.  Respiratory: Negative.  Negative for cough and shortness of breath.   Cardiovascular: Negative.  Negative for chest pain, palpitations and leg swelling.  Gastrointestinal: Negative.  Negative for heartburn, nausea and vomiting.  Genitourinary:        ED  Musculoskeletal: Negative.  Negative for myalgias.  Neurological: Negative.  Negative for dizziness, focal weakness, seizures and headaches.  Psychiatric/Behavioral: Negative.  Negative for suicidal ideas.     Past Medical History:  Diagnosis Date   DM Type 2    pt does not check cbg diagnosed 05-2021 per pt   Hematuria    for last week @ times dr pace aware per pt   History of COVID-19 2020   internal fever loss of taste and smell, dizzy @ times when in sun for 2 months all symptoms resolved   Hypercholesteremia    Numbness 06/11/2021   right fingertips @ times    Past Surgical History:  Procedure Laterality Date   CYSTOSCOPY WITH RETROGRADE PYELOGRAM, URETEROSCOPY AND STENT PLACEMENT Right 05/27/2021   Procedure: CYSTOSCOPY WITH RETROGRADE PYELOGRAM, URETEROSCOPY AND STENT PLACEMENT;  Surgeon: Noel Christmas, MD;  Location: [facility] OR;  Service: Urology;  Laterality: Right;   CYSTOSCOPY/URETEROSCOPY/HOLMIUM LASER/STENT PLACEMENT Right 06/15/2021   Procedure: CYSTOSCOPY/URETEROSCOPY/HOLMIUM LASER/STENT EXCHANGE;  Surgeon: Noel Christmas, MD;  Location: [facility] ;  Service: Urology;  Laterality: Right;   LAPAROSCOPIC APPENDECTOMY N/A 08/13/2015   Procedure: APPENDECTOMY LAPAROSCOPIC;  Surgeon:  Emelia Loron, MD;  Location: [facility] OR;  Service: General;  Laterality: N/A;    History reviewed. No pertinent family history.  Social History Reviewed with no changes to be made today.   Outpatient Medications Prior to Visit  Medication Sig Dispense Refill   glimepiride (AMARYL) 1 MG tablet Take 1 tablet (1 mg total) by mouth daily with breakfast. 90 tablet 1   No facility-administered medications prior to visit.    No Known Allergies     Objective:    BP 134/75 (BP Location: Left Arm, Patient Position: Sitting, Cuff Size: Normal)   Pulse 70   Ht 5\' 2"  (1.575 m)   Wt 221 lb 3.2 oz (100.3 kg)   SpO2 96%   BMI 40.46 kg/m  Wt Readings from Last 3 Encounters:  03/08/23 221 lb 3.2 oz (100.3 kg)  06/01/22 222 lb 12.8 oz (101.1 kg)  02/25/22 216 lb (98 kg)    Physical Exam Vitals and nursing note reviewed.  Constitutional:      Appearance: He is well-developed.  HENT:     Head: Normocephalic and atraumatic.  Cardiovascular:     Rate and Rhythm: Normal rate and regular rhythm.     Heart sounds: Normal heart sounds. No murmur heard.    No friction rub. No gallop.  Pulmonary:     Effort: Pulmonary effort is normal. No tachypnea or respiratory distress.     Breath sounds: Normal breath sounds. No decreased breath sounds, wheezing, rhonchi or rales.  Chest:     Chest wall: No tenderness.  Abdominal:     General: Bowel sounds are normal.     Palpations: Abdomen is soft.  Musculoskeletal:        General: Normal range of motion.     Cervical back: Normal range of motion.  Skin:    General: Skin is warm and dry.  Neurological:     Mental Status: He is alert and oriented to person, place, and time.     Coordination: Coordination normal.  Psychiatric:        Behavior: Behavior normal. Behavior is cooperative.        Thought Content: Thought content normal.        Judgment: Judgment normal.          Patient has been counseled extensively about nutrition and exercise  as well as the importance of adherence with medications and regular follow-up. The patient was given clear instructions to go to ER or return to medical center if symptoms don't improve, worsen or new problems develop. The patient verbalized understanding.   Follow-up: Return in about 3 months (around 06/07/2023).   Claiborne Rigg, FNP-BC [facility] [place], [place] [phone]   03/13/2023, 7:41 PM

## 2023-03-09 LAB — MICROALBUMIN / CREATININE URINE RATIO
Creatinine, Urine: 184.8 mg/dL
Microalb/Creat Ratio: 35 mg/g{creat} — ABNORMAL HIGH (ref 0–29)
Microalbumin, Urine: 63.8 ug/mL

## 2023-03-10 ENCOUNTER — Other Ambulatory Visit: Payer: Self-pay | Admitting: Nurse Practitioner

## 2023-03-10 ENCOUNTER — Other Ambulatory Visit: Payer: Self-pay

## 2023-03-10 DIAGNOSIS — R770 Abnormality of albumin: Secondary | ICD-10-CM

## 2023-03-10 LAB — CMP14+EGFR
ALT: 36 IU/L (ref 0–44)
AST: 27 IU/L (ref 0–40)
Albumin: 4.5 g/dL (ref 4.1–5.1)
Alkaline Phosphatase: 96 IU/L (ref 44–121)
BUN/Creatinine Ratio: 17 (ref 9–20)
BUN: 17 mg/dL (ref 6–24)
Bilirubin Total: <0.2 mg/dL (ref 0.0–1.2)
CO2: 21 mmol/L (ref 20–29)
Calcium: 9.5 mg/dL (ref 8.7–10.2)
Chloride: 99 mmol/L (ref 96–106)
Creatinine, Ser: 0.99 mg/dL (ref 0.76–1.27)
Globulin, Total: 2.8 g/dL (ref 1.5–4.5)
Glucose: 153 mg/dL — ABNORMAL HIGH (ref 70–99)
Potassium: 4.2 mmol/L (ref 3.5–5.2)
Sodium: 137 mmol/L (ref 134–144)
Total Protein: 7.3 g/dL (ref 6.0–8.5)
eGFR: 96 mL/min/{1.73_m2} (ref >59)

## 2023-03-10 MED ORDER — LISINOPRIL 5 MG PO TABS
5.0000 mg | ORAL_TABLET | Freq: Every day | ORAL | 3 refills | Status: DC
  Filled 2023-03-10: qty 90, 90d supply, fill #0
  Filled 2023-05-16 (×2): qty 90, 90d supply, fill #1
  Filled 2023-10-05: qty 90, 90d supply, fill #2

## 2023-03-13 ENCOUNTER — Encounter: Payer: Self-pay | Admitting: Nurse Practitioner

## 2023-05-16 ENCOUNTER — Other Ambulatory Visit: Payer: Self-pay

## 2023-05-17 ENCOUNTER — Other Ambulatory Visit: Payer: Self-pay

## 2023-06-07 ENCOUNTER — Other Ambulatory Visit: Payer: Self-pay

## 2023-06-07 ENCOUNTER — Encounter: Payer: Self-pay | Admitting: Nurse Practitioner

## 2023-06-07 ENCOUNTER — Ambulatory Visit: Payer: Self-pay | Attending: Nurse Practitioner | Admitting: Nurse Practitioner

## 2023-06-07 VITALS — BP 124/77 | HR 64 | Ht 62.0 in | Wt 221.8 lb

## 2023-06-07 DIAGNOSIS — Z7984 Long term (current) use of oral hypoglycemic drugs: Secondary | ICD-10-CM

## 2023-06-07 DIAGNOSIS — E1165 Type 2 diabetes mellitus with hyperglycemia: Secondary | ICD-10-CM

## 2023-06-07 DIAGNOSIS — E785 Hyperlipidemia, unspecified: Secondary | ICD-10-CM

## 2023-06-07 LAB — POCT GLYCOSYLATED HEMOGLOBIN (HGB A1C): HbA1c, POC (controlled diabetic range): 6.3 % (ref 0.0–7.0)

## 2023-06-07 MED ORDER — GLIMEPIRIDE 2 MG PO TABS
2.0000 mg | ORAL_TABLET | Freq: Every day | ORAL | 1 refills | Status: DC
  Filled 2023-06-07 – 2023-08-07 (×3): qty 90, 90d supply, fill #0

## 2023-06-07 MED ORDER — ATORVASTATIN CALCIUM 20 MG PO TABS: 20.0000 mg | ORAL_TABLET | Freq: Every day | ORAL | 3 refills | Status: DC

## 2023-06-07 MED ORDER — ATORVASTATIN CALCIUM 20 MG PO TABS
20.0000 mg | ORAL_TABLET | Freq: Every day | ORAL | 3 refills | Status: DC
  Filled 2023-06-07 – 2023-06-21 (×2): qty 90, 90d supply, fill #0

## 2023-06-07 NOTE — Progress Notes (Signed)
Assessment & Plan:  Tristan Hall was seen today for medical management of chronic issues.  Diagnoses and all orders for this visit:  Type 2 diabetes mellitus with hyperglycemia, without long-term current use of insulin (HCC) -     POCT glycosylated hemoglobin (Hb A1C) Continue blood sugar control as discussed in office today, low carbohydrate diet, and regular physical exercise as tolerated, 150 minutes per week (30 min each day, 5 days per week, or 50 min 3 days per week). Keep blood sugar logs with fasting goal of 90-130 mg/dl, post prandial (after you eat) less than 180.  For Hypoglycemia: BS <60 and Hyperglycemia BS >400; contact the clinic ASAP. Annual eye exams and foot exams are recommended.   Dyslipidemia, goal LDL below 70 -     Lipid panel -     atorvastatin (LIPITOR) 20 MG tablet; Take 1 tablet (20 mg total) by mouth daily. For cholesterol    Patient has been counseled on age-appropriate routine health concerns for screening and prevention. These are reviewed and up-to-date. Referrals have been placed accordingly. Immunizations are up-to-date or declined.    Subjective:   Chief Complaint  Patient presents with   Medical Management of Chronic Issues    Tristan Hall 47 y.o. male presents to office today for follow up to DM.    VRI was used to communicate directly with patient for the entire encounter including providing detailed patient instructions.     DM 2 Currently prescribed glimepiride 2 mg daily. Renal dose ACE and statin has been added today. A1c down from 8.5 to 6.3 and at goal. He is doing well today and tolerating glimepiride with no side effects.  Lab Results  Component Value Date   HGBA1C 6.3 06/07/2023    Lab Results  Component Value Date   HGBA1C 8.5 (A) 03/08/2023     Review of Systems  Constitutional:  Negative for fever, malaise/fatigue and weight loss.  HENT: Negative.  Negative for nosebleeds.   Eyes: Negative.  Negative for  blurred vision, double vision and photophobia.  Respiratory: Negative.  Negative for cough and shortness of breath.   Cardiovascular: Negative.  Negative for chest pain, palpitations and leg swelling.  Gastrointestinal: Negative.  Negative for heartburn, nausea and vomiting.  Musculoskeletal: Negative.  Negative for myalgias.  Neurological: Negative.  Negative for dizziness, focal weakness, seizures and headaches.  Psychiatric/Behavioral: Negative.  Negative for suicidal ideas.     Past Medical History:  Diagnosis Date   DM Type 2    pt does not check cbg diagnosed 05-2021 per pt   Hematuria    for last week @ times dr pace aware per pt   History of COVID-19 2020   internal fever loss of taste and smell, dizzy @ times when in sun for 2 months all symptoms resolved   Hypercholesteremia    Numbness 06/11/2021   right fingertips @ times    Past Surgical History:  Procedure Laterality Date   CYSTOSCOPY WITH RETROGRADE PYELOGRAM, URETEROSCOPY AND STENT PLACEMENT Right 05/27/2021   Procedure: CYSTOSCOPY WITH RETROGRADE PYELOGRAM, URETEROSCOPY AND STENT PLACEMENT;  Surgeon: Noel Christmas, MD;  Location: [facility] OR;  Service: Urology;  Laterality: Right;   CYSTOSCOPY/URETEROSCOPY/HOLMIUM LASER/STENT PLACEMENT Right 06/15/2021   Procedure: CYSTOSCOPY/URETEROSCOPY/HOLMIUM LASER/STENT EXCHANGE;  Surgeon: Noel Christmas, MD;  Location: [facility];  Service: Urology;  Laterality: Right;   LAPAROSCOPIC APPENDECTOMY N/A 08/13/2015   Procedure: APPENDECTOMY LAPAROSCOPIC;  Surgeon: Emelia Loron, MD;  Location: [facility] OR;  Service: General;  Laterality: N/A;    History reviewed. No pertinent family history.  Social History Reviewed with no changes to be made today.   Outpatient Medications Prior to Visit  Medication Sig Dispense Refill   Blood Glucose Monitoring Suppl (TRUE METRIX METER) w/Device KIT Use as instructed. Check blood glucose level by fingerstick twice per day. 1 kit  0   glimepiride (AMARYL) 2 MG tablet Take 1 tablet (2 mg total) by mouth daily with breakfast. 90 tablet 1   glucose blood (TRUE METRIX BLOOD GLUCOSE TEST) test strip Use as instructed. Check blood glucose level by fingerstick twice per day. 100 each 12   lisinopril (ZESTRIL) 5 MG tablet Take 1 tablet (5 mg total) by mouth daily. To protect kidneys 90 tablet 3   sildenafil (VIAGRA) 100 MG tablet Take 0.5-1 tablets (50-100 mg total) by mouth daily as needed for erectile dysfunction. 5 tablet 11   TRUEplus Lancets 28G MISC Use as instructed. Check blood glucose level by fingerstick twice per day. 100 each 3   No facility-administered medications prior to visit.    No Known Allergies     Objective:    BP 124/77 (BP Location: Left Arm, Patient Position: Sitting, Cuff Size: Normal)   Pulse 64   Ht 5\' 2"  (1.575 m)   Wt 221 lb 12.8 oz (100.6 kg)   SpO2 98%   BMI 40.57 kg/m  Wt Readings from Last 3 Encounters:  06/07/23 221 lb 12.8 oz (100.6 kg)  03/08/23 221 lb 3.2 oz (100.3 kg)  06/01/22 222 lb 12.8 oz (101.1 kg)    Physical Exam Vitals and nursing note reviewed.  Constitutional:      Appearance: He is well-developed.  HENT:     Head: Normocephalic and atraumatic.  Cardiovascular:     Rate and Rhythm: Normal rate and regular rhythm.     Heart sounds: Normal heart sounds. No murmur heard.    No friction rub. No gallop.  Pulmonary:     Effort: Pulmonary effort is normal. No tachypnea or respiratory distress.     Breath sounds: Normal breath sounds. No decreased breath sounds, wheezing, rhonchi or rales.  Chest:     Chest wall: No tenderness.  Abdominal:     General: Bowel sounds are normal.     Palpations: Abdomen is soft.  Musculoskeletal:        General: Normal range of motion.     Cervical back: Normal range of motion.  Skin:    General: Skin is warm and dry.  Neurological:     Mental Status: He is alert and oriented to person, place, and time.     Coordination:  Coordination normal.  Psychiatric:        Behavior: Behavior normal. Behavior is cooperative.        Thought Content: Thought content normal.        Judgment: Judgment normal.          Patient has been counseled extensively about nutrition and exercise as well as the importance of adherence with medications and regular follow-up. The patient was given clear instructions to go to ER or return to medical center if symptoms don't improve, worsen or new problems develop. The patient verbalized understanding.   Follow-up: Return in about 3 months (around 09/05/2023).   Claiborne Rigg, FNP-BC [facility] [place], [place] [phone]   06/07/2023, 8:58 AM

## 2023-06-08 LAB — LIPID PANEL
Chol/HDL Ratio: 7.3 {ratio} — ABNORMAL HIGH (ref 0.0–5.0)
Cholesterol, Total: 203 mg/dL — ABNORMAL HIGH (ref 100–199)
HDL: 28 mg/dL — ABNORMAL LOW (ref >39)
LDL Chol Calc (NIH): 117 mg/dL — ABNORMAL HIGH (ref 0–99)
Triglycerides: 328 mg/dL — ABNORMAL HIGH (ref 0–149)
VLDL Cholesterol Cal: 58 mg/dL — ABNORMAL HIGH (ref 5–40)

## 2023-06-19 ENCOUNTER — Other Ambulatory Visit: Payer: Self-pay

## 2023-06-21 ENCOUNTER — Other Ambulatory Visit: Payer: Self-pay

## 2023-06-22 ENCOUNTER — Other Ambulatory Visit: Payer: Self-pay

## 2023-07-31 ENCOUNTER — Other Ambulatory Visit: Payer: Self-pay

## 2023-08-07 ENCOUNTER — Other Ambulatory Visit: Payer: Self-pay

## 2023-08-10 ENCOUNTER — Other Ambulatory Visit: Payer: Self-pay

## 2023-08-14 ENCOUNTER — Other Ambulatory Visit: Payer: Self-pay

## 2023-09-18 ENCOUNTER — Ambulatory Visit: Payer: Self-pay | Attending: Nurse Practitioner | Admitting: Nurse Practitioner

## 2023-09-18 ENCOUNTER — Other Ambulatory Visit: Payer: Self-pay

## 2023-09-18 ENCOUNTER — Encounter: Payer: Self-pay | Admitting: Nurse Practitioner

## 2023-09-18 VITALS — BP 119/77 | HR 62 | Ht 62.0 in | Wt 223.8 lb

## 2023-09-18 DIAGNOSIS — M25521 Pain in right elbow: Secondary | ICD-10-CM

## 2023-09-18 DIAGNOSIS — E785 Hyperlipidemia, unspecified: Secondary | ICD-10-CM

## 2023-09-18 DIAGNOSIS — Z7984 Long term (current) use of oral hypoglycemic drugs: Secondary | ICD-10-CM

## 2023-09-18 DIAGNOSIS — E119 Type 2 diabetes mellitus without complications: Secondary | ICD-10-CM

## 2023-09-18 MED ORDER — DICLOFENAC SODIUM 1 % EX GEL
2.0000 g | Freq: Four times a day (QID) | CUTANEOUS | 1 refills | Status: AC
  Filled 2023-09-18: qty 100, 13d supply, fill #0

## 2023-09-18 MED ORDER — ATORVASTATIN CALCIUM 20 MG PO TABS
20.0000 mg | ORAL_TABLET | Freq: Every day | ORAL | 3 refills | Status: DC
  Filled 2023-09-18: qty 90, 90d supply, fill #0

## 2023-09-18 NOTE — Progress Notes (Signed)
 Assessment & Plan:  Tristan Hall was seen today for medical management of chronic issues.  Diagnoses and all orders for this visit:  Diabetes mellitus treated with oral medication (HCC) -     Hemoglobin A1c -     CMP14+EGFR  Dyslipidemia, goal LDL below 70 -     Lipid panel -     atorvastatin (LIPITOR) 20 MG tablet; Take 1 tablet (20 mg total) by mouth daily. For cholesterol  Right elbow pain -     diclofenac Sodium (VOLTAREN) 1 % GEL; Apply 2 g topically 4 (four) times daily.    Patient has been counseled on age-appropriate routine health concerns for screening and prevention. These are reviewed and up-to-date. Referrals have been placed accordingly. Immunizations are up-to-date or declined.    Subjective:   Chief Complaint  Patient presents with   Medical Management of Chronic Issues    Tristan Hall 48 y.o. male presents to office today for follow up to DM  VRI was used to communicate directly with patient for the entire encounter including providing detailed patient instructions.       DM 2 Diabetes is well controlled. Currently prescribed glimepiride 2 mg daily. Renal dose ACE and statin has been added today. A1c down from 8.5 to 6.3 and at goal. He is doing well today and tolerating glimepiride with no side effects. Average BG home readings 130-140s.  Lab Results  Component Value Date   HGBA1C 6.3 06/07/2023      HTN Blood pressure is well controlled. He is taking renal dose ACE as prescribed.  BP Readings from Last 3 Encounters:  09/18/23 119/77  06/07/23 124/77  03/08/23 134/75       Review of Systems  Constitutional:  Negative for fever, malaise/fatigue and weight loss.  HENT: Negative.  Negative for nosebleeds.   Eyes: Negative.  Negative for blurred vision, double vision and photophobia.  Respiratory: Negative.  Negative for cough and shortness of breath.   Cardiovascular: Negative.  Negative for chest pain, palpitations and leg swelling.   Gastrointestinal: Negative.  Negative for heartburn, nausea and vomiting.  Musculoskeletal: Negative.  Negative for myalgias.  Neurological: Negative.  Negative for dizziness, focal weakness, seizures and headaches.  Psychiatric/Behavioral: Negative.  Negative for suicidal ideas.     Past Medical History:  Diagnosis Date   DM Type 2    pt does not check cbg diagnosed 05-2021 per pt   Hematuria    for last week @ times dr pace aware per pt   History of COVID-19 2020   internal fever loss of taste and smell, dizzy @ times when in sun for 2 months all symptoms resolved   Hypercholesteremia    Numbness 06/11/2021   right fingertips @ times    Past Surgical History:  Procedure Laterality Date   CYSTOSCOPY WITH RETROGRADE PYELOGRAM, URETEROSCOPY AND STENT PLACEMENT Right 05/27/2021   Procedure: CYSTOSCOPY WITH RETROGRADE PYELOGRAM, URETEROSCOPY AND STENT PLACEMENT;  Surgeon: Noel Christmas, MD;  Location: [facility] OR;  Service: Urology;  Laterality: Right;   CYSTOSCOPY/URETEROSCOPY/HOLMIUM LASER/STENT PLACEMENT Right 06/15/2021   Procedure: CYSTOSCOPY/URETEROSCOPY/HOLMIUM LASER/STENT EXCHANGE;  Surgeon: Noel Christmas, MD;  Location: [facility];  Service: Urology;  Laterality: Right;   LAPAROSCOPIC APPENDECTOMY N/A 08/13/2015   Procedure: APPENDECTOMY LAPAROSCOPIC;  Surgeon: Emelia Loron, MD;  Location: [facility] OR;  Service: General;  Laterality: N/A;    No family history on file.  Social History Reviewed with no changes to be made today.  Outpatient Medications Prior to Visit  Medication Sig Dispense Refill   Blood Glucose Monitoring Suppl (TRUE METRIX METER) w/Device KIT Use as instructed. Check blood glucose level by fingerstick twice per day. 1 kit 0   glimepiride (AMARYL) 2 MG tablet Take 1 tablet (2 mg total) by mouth daily with breakfast. For diabetes 90 tablet 1   glucose blood (TRUE METRIX BLOOD GLUCOSE TEST) test strip Use as instructed. Check blood glucose  level by fingerstick twice per day. 100 each 12   sildenafil (VIAGRA) 100 MG tablet Take 0.5-1 tablets (50-100 mg total) by mouth daily as needed for erectile dysfunction. 5 tablet 11   TRUEplus Lancets 28G MISC Use as instructed. Check blood glucose level by fingerstick twice per day. 100 each 3   atorvastatin (LIPITOR) 20 MG tablet Take 1 tablet (20 mg total) by mouth daily. For cholesterol 90 tablet 3   lisinopril (ZESTRIL) 5 MG tablet Take 1 tablet (5 mg total) by mouth daily. To protect kidneys (Patient not taking: Reported on 09/18/2023) 90 tablet 3   No facility-administered medications prior to visit.    No Known Allergies     Objective:    BP 119/77 (BP Location: Left Arm, Patient Position: Sitting, Cuff Size: Normal)   Pulse 62   Ht 5\' 2"  (1.575 m)   Wt 223 lb 12.8 oz (101.5 kg)   BMI 40.93 kg/m  Wt Readings from Last 3 Encounters:  09/18/23 223 lb 12.8 oz (101.5 kg)  06/07/23 221 lb 12.8 oz (100.6 kg)  03/08/23 221 lb 3.2 oz (100.3 kg)    Physical Exam Vitals and nursing note reviewed.  Constitutional:      Appearance: He is well-developed.  HENT:     Head: Normocephalic and atraumatic.  Cardiovascular:     Rate and Rhythm: Normal rate and regular rhythm.     Heart sounds: Normal heart sounds. No murmur heard.    No friction rub. No gallop.  Pulmonary:     Effort: Pulmonary effort is normal. No tachypnea or respiratory distress.     Breath sounds: Normal breath sounds. No decreased breath sounds, wheezing, rhonchi or rales.  Chest:     Chest wall: No tenderness.  Abdominal:     General: Bowel sounds are normal.     Palpations: Abdomen is soft.  Musculoskeletal:        General: Normal range of motion.     Cervical back: Normal range of motion.  Skin:    General: Skin is warm and dry.  Neurological:     Mental Status: He is alert and oriented to person, place, and time.     Coordination: Coordination normal.  Psychiatric:        Behavior: Behavior normal.  Behavior is cooperative.        Thought Content: Thought content normal.        Judgment: Judgment normal.          Patient has been counseled extensively about nutrition and exercise as well as the importance of adherence with medications and regular follow-up. The patient was given clear instructions to go to ER or return to medical center if symptoms don't improve, worsen or new problems develop. The patient verbalized understanding.   Follow-up: Return in about 3 months (around 12/19/2023).   Claiborne Rigg, FNP-BC [facility] [place], [place] [phone]   09/18/2023, 8:50 AM

## 2023-09-19 ENCOUNTER — Encounter: Payer: Self-pay | Admitting: Nurse Practitioner

## 2023-09-19 LAB — CMP14+EGFR
ALT: 27 IU/L (ref 0–44)
AST: 19 IU/L (ref 0–40)
Albumin: 4.4 g/dL (ref 4.1–5.1)
Alkaline Phosphatase: 92 IU/L (ref 44–121)
BUN/Creatinine Ratio: 16 (ref 9–20)
BUN: 15 mg/dL (ref 6–24)
Bilirubin Total: 0.2 mg/dL (ref 0.0–1.2)
CO2: 22 mmol/L (ref 20–29)
Calcium: 9.6 mg/dL (ref 8.7–10.2)
Chloride: 101 mmol/L (ref 96–106)
Creatinine, Ser: 0.94 mg/dL (ref 0.76–1.27)
Globulin, Total: 2.8 g/dL (ref 1.5–4.5)
Glucose: 146 mg/dL — ABNORMAL HIGH (ref 70–99)
Potassium: 4.7 mmol/L (ref 3.5–5.2)
Sodium: 140 mmol/L (ref 134–144)
Total Protein: 7.2 g/dL (ref 6.0–8.5)
eGFR: 102 mL/min/{1.73_m2} (ref >59)

## 2023-09-19 LAB — LIPID PANEL
Chol/HDL Ratio: 4.9 ratio (ref 0.0–5.0)
Cholesterol, Total: 152 mg/dL (ref 100–199)
HDL: 31 mg/dL — ABNORMAL LOW (ref >39)
LDL Chol Calc (NIH): 74 mg/dL (ref 0–99)
Triglycerides: 286 mg/dL — ABNORMAL HIGH (ref 0–149)
VLDL Cholesterol Cal: 47 mg/dL — ABNORMAL HIGH (ref 5–40)

## 2023-09-19 LAB — HEMOGLOBIN A1C
Est. average glucose Bld gHb Est-mCnc: 151 mg/dL
Hgb A1c MFr Bld: 6.9 % — ABNORMAL HIGH (ref 4.8–5.6)

## 2023-10-05 ENCOUNTER — Other Ambulatory Visit: Payer: Self-pay

## 2023-10-05 ENCOUNTER — Other Ambulatory Visit: Payer: Self-pay | Admitting: Nurse Practitioner

## 2023-10-05 DIAGNOSIS — E1165 Type 2 diabetes mellitus with hyperglycemia: Secondary | ICD-10-CM

## 2023-10-05 DIAGNOSIS — R770 Abnormality of albumin: Secondary | ICD-10-CM

## 2023-10-05 NOTE — Telephone Encounter (Signed)
 Copied from CRM [id number]. Topic: Clinical - Medication Refill >> Oct 05, 2023  1:43 PM Tristan Hall wrote: Most Recent Primary Care Visit:  Provider: Bertram Denver W  Department: [facility]  Visit Type: OFFICE VISIT  Date: 09/18/2023  Medication: glimepiride (AMARYL) 2 MG tablet  Has the patient contacted their pharmacy? Yes Said to call office  Is this the correct pharmacy for this prescription? Yes If no, delete pharmacy and type the correct one.  This is the patient's preferred pharmacy:    [facility] [address] [place] [address] Phone: [phone] Fax: [phone]   Has the prescription been filled recently? No  Is the patient out of the medication? Yes  Has the patient been seen for an appointment in the last year OR does the patient have an upcoming appointment? Yes  Can we respond through MyChart? Yes  Agent: Please be advised that Rx refills may take up to 3 business days. We ask that you follow-up with your pharmacy.

## 2023-10-05 NOTE — Telephone Encounter (Signed)
 Copied from CRM [id number]. Topic: Clinical - Medication Refill >> Oct 05, 2023  1:45 PM Geroge Baseman wrote: Most Recent Primary Care Visit:  Provider: Bertram Denver W  [facility]  Visit Type: OFFICE VISIT  Date: 09/18/2023  Medication:  lisinopril (ZESTRIL) 5 MG tablet    Has the patient contacted their pharmacy? Yes Call office  Is this the correct pharmacy for this prescription? Yes If no, delete pharmacy and type the correct one.  This is the patient's preferred pharmacy:    [facility] [address], [address] [place] [address] Phone: [phone] Fax: [phone]   Has the prescription been filled recently? No  Is the patient out of the medication? Yes  Has the patient been seen for an appointment in the last year OR does the patient have an upcoming appointment? Yes  Can we respond through MyChart? No  Agent: Please be advised that Rx refills may take up to 3 business days. We ask that you follow-up with your pharmacy.

## 2023-10-06 ENCOUNTER — Other Ambulatory Visit: Payer: Self-pay

## 2023-10-06 MED ORDER — GLIMEPIRIDE 2 MG PO TABS
2.0000 mg | ORAL_TABLET | Freq: Every day | ORAL | 0 refills | Status: DC
  Filled 2023-10-17: qty 90, 90d supply, fill #0

## 2023-10-06 MED ORDER — LISINOPRIL 5 MG PO TABS
5.0000 mg | ORAL_TABLET | Freq: Every day | ORAL | 1 refills | Status: DC
  Filled 2023-10-17: qty 90, 90d supply, fill #0
  Filled 2024-01-31: qty 90, 90d supply, fill #1

## 2023-10-06 NOTE — Telephone Encounter (Signed)
 Requested Prescriptions  Pending Prescriptions Disp Refills   glimepiride (AMARYL) 2 MG tablet 90 tablet 0    Sig: Take 1 tablet (2 mg total) by mouth daily with breakfast. For diabetes     Endocrinology:  Diabetes - Sulfonylureas Passed - 10/06/2023 12:34 PM      Passed - HBA1C is between 0 and 7.9 and within 180 days    HbA1c, POC (controlled diabetic range)  Date Value Ref Range Status  06/07/2023 6.3 0.0 - 7.0 % Final   Hgb A1c MFr Bld  Date Value Ref Range Status  09/18/2023 6.9 (H) 4.8 - 5.6 % Final    Comment:             Prediabetes: 5.7 - 6.4          Diabetes: >6.4          Glycemic control for adults with diabetes: <7.0          Passed - Cr in normal range and within 360 days    Creatinine, Ser  Date Value Ref Range Status  09/18/2023 0.94 0.76 - 1.27 mg/dL Final         Passed - Valid encounter within last 6 months    Recent Outpatient Visits           2 weeks ago Diabetes mellitus treated with oral medication (HCC)   [facility] [place] [place], [place] W, NP   4 months ago Type 2 diabetes mellitus with hyperglycemia, without long-term current use of insulin (HCC)   [facility] Of [place]. [facility] [place], [place] W, NP   7 months ago Type 2 diabetes mellitus with hyperglycemia, without long-term current use of insulin (HCC)   [facility] Dept Of [place]. [facility] [place], [place] W, NP   1 year ago Type 2 diabetes mellitus with hyperglycemia, without long-term current use of insulin (HCC)   [facility] Dept Of [place]. [facility] [place], [place] W, NP   1 year ago Type 2 diabetes mellitus with hyperglycemia, without long-term current use of insulin (HCC)   [facility] Dept Of [place]. [facility] Claiborne Rigg, NP       Future Appointments             In 2  months Claiborne Rigg, NP [facility] - A Dept Of . [facility]

## 2023-10-06 NOTE — Telephone Encounter (Signed)
 Requested Prescriptions  Pending Prescriptions Disp Refills   lisinopril (ZESTRIL) 5 MG tablet 90 tablet 1    Sig: Take 1 tablet (5 mg total) by mouth daily. To protect kidneys     Cardiovascular:  ACE Inhibitors Passed - 10/06/2023 12:34 PM      Passed - Cr in normal range and within 180 days    Creatinine, Ser  Date Value Ref Range Status  09/18/2023 0.94 0.76 - 1.27 mg/dL Final         Passed - K in normal range and within 180 days    Potassium  Date Value Ref Range Status  09/18/2023 4.7 3.5 - 5.2 mmol/L Final         Passed - Patient is not pregnant      Passed - Last BP in normal range    BP Readings from Last 1 Encounters:  09/18/23 119/77         Passed - Valid encounter within last 6 months    Recent Outpatient Visits           2 weeks ago Diabetes mellitus treated with oral medication (HCC)   [facility] Of [place]. [facility] [place], [place] W, NP   4 months ago Type 2 diabetes mellitus with hyperglycemia, without long-term current use of insulin (HCC)   [facility] Dept Of [place]. [facility] [place], [place] W, NP   7 months ago Type 2 diabetes mellitus with hyperglycemia, without long-term current use of insulin (HCC)   [facility] Dept Of [place]. [facility] [place], [place] W, NP   1 year ago Type 2 diabetes mellitus with hyperglycemia, without long-term current use of insulin (HCC)   [facility] - [facility] Dept Of [place]. [facility] [place], [place] W, NP   1 year ago Type 2 diabetes mellitus with hyperglycemia, without long-term current use of insulin (HCC)   [facility] - [facility] Dept Of [place]. [facility] Claiborne Rigg, NP       Future Appointments             In 2 months Claiborne Rigg, NP [facility] - A Dept Of . [facility]

## 2023-10-10 ENCOUNTER — Other Ambulatory Visit: Payer: Self-pay

## 2023-10-17 ENCOUNTER — Other Ambulatory Visit: Payer: Self-pay

## 2023-10-18 ENCOUNTER — Other Ambulatory Visit: Payer: Self-pay

## 2023-11-14 IMAGING — CT CT RENAL STONE PROTOCOL
2 of 4 series · 16 of 46 positions shown, 18 images · non-contrast
Comparison: CT abdomen pelvis dated December 27, 2017.

CLINICAL DATA: Right flank pain.

EXAM:
CT ABDOMEN AND PELVIS WITHOUT CONTRAST
TECHNIQUE: Multidetector CT imaging of the abdomen and pelvis was performed
following the standard protocol without IV contrast.

[Series 3: ap without · axial · non-contrast · 0.81mm/px · z∈[+720,+1125]mm · 13 of 93 slices shown, 15 images]
[im 6/93  soft-tissue]
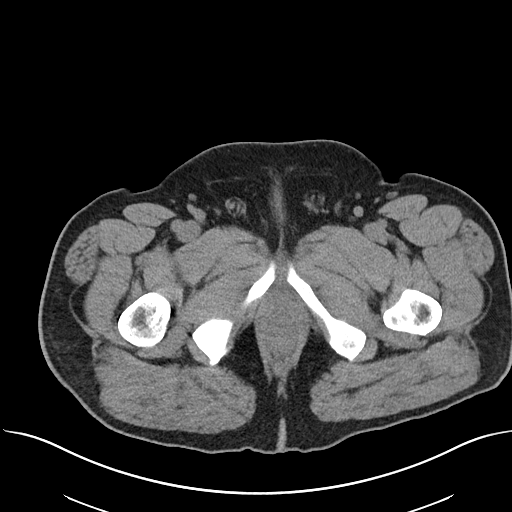
[im 6/93  bone]
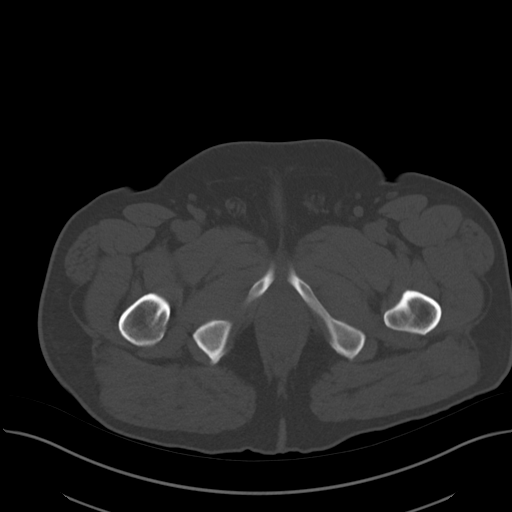
[im 12/93  soft-tissue]
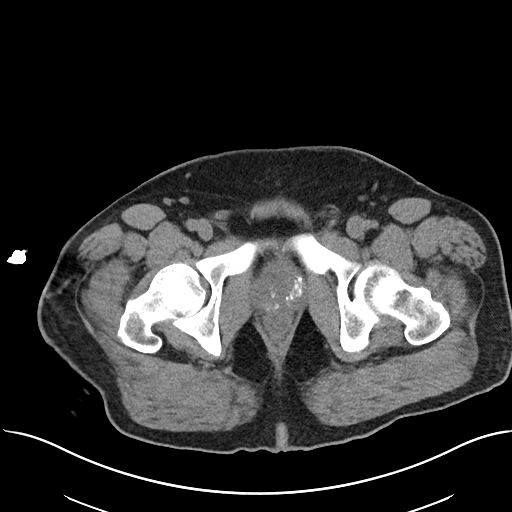
[im 18/93  soft-tissue]
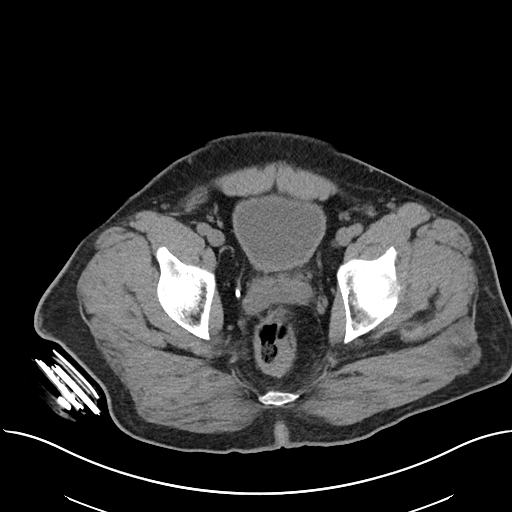
[im 29/93  soft-tissue]
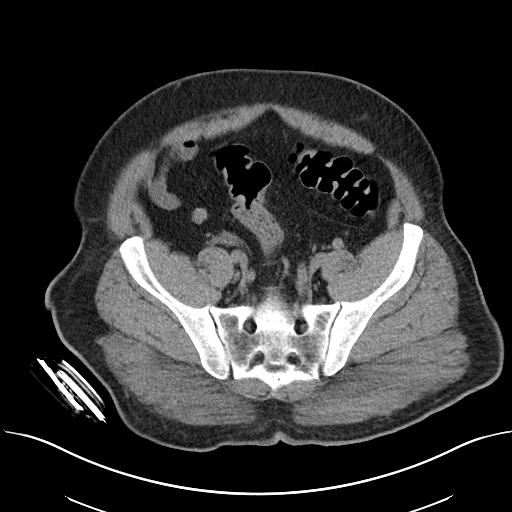
[im 35/93  soft-tissue]
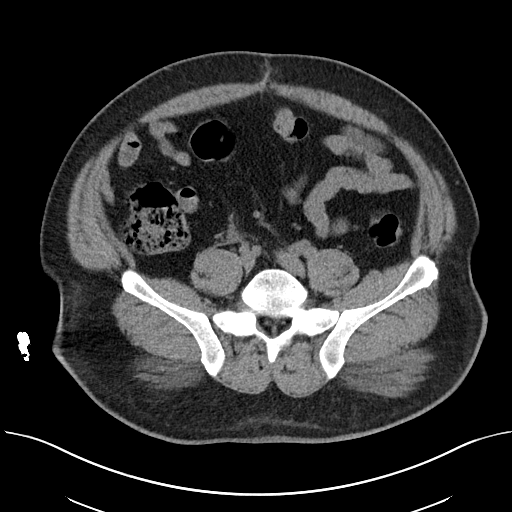
[im 41/93  soft-tissue]
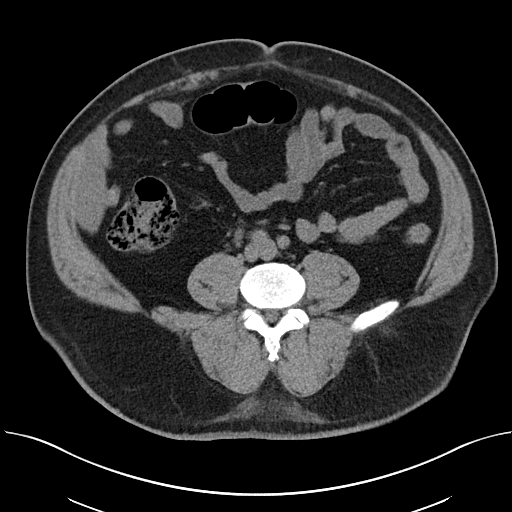
[im 47/93  soft-tissue]
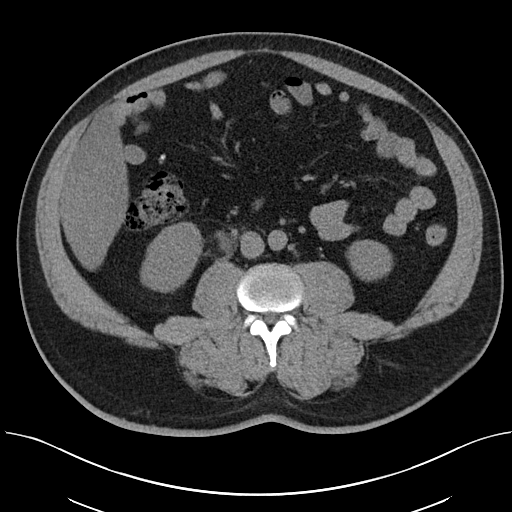
[im 52/93  soft-tissue]
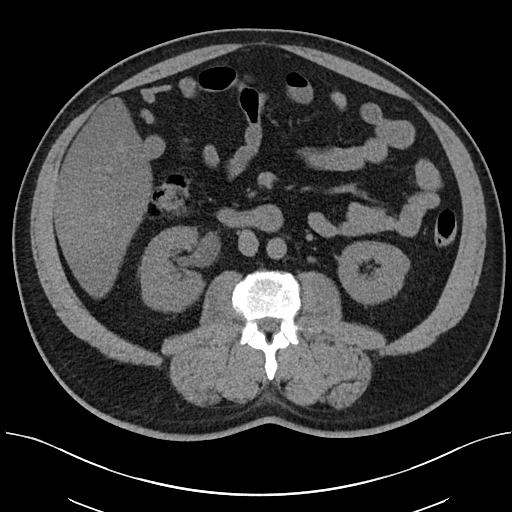
[im 58/93  soft-tissue]
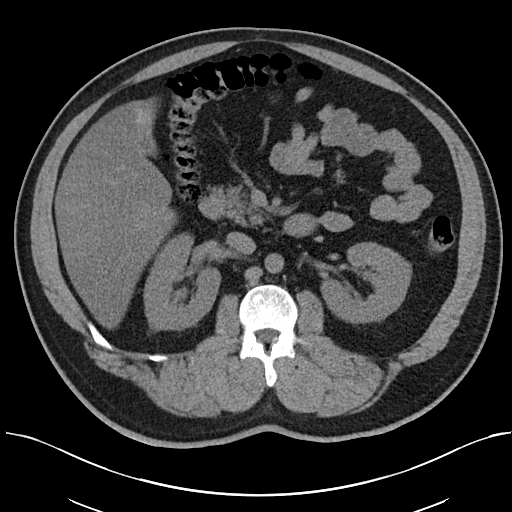
[im 58/93  bone]
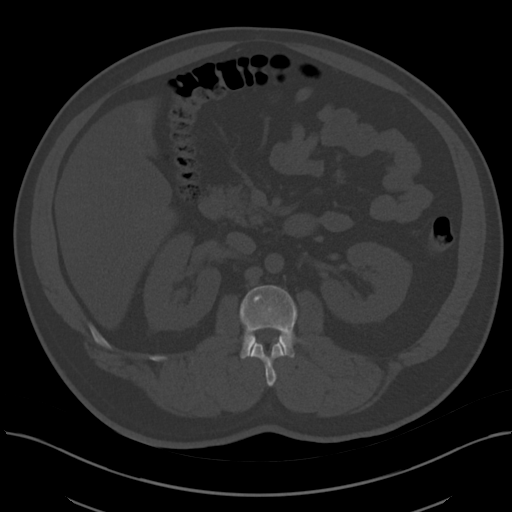
[im 64/93  soft-tissue]
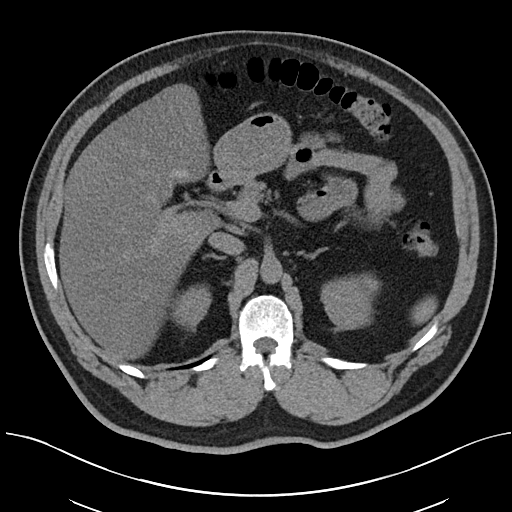
[im 75/93  soft-tissue]
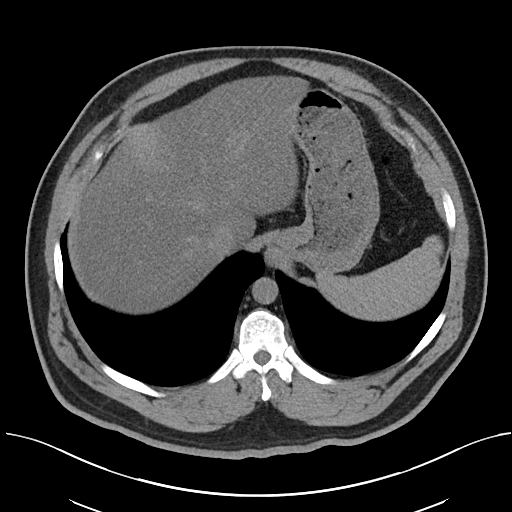
[im 81/93  soft-tissue]
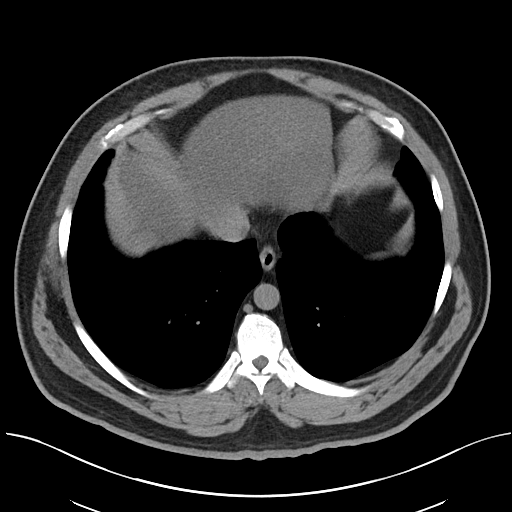
[im 87/93  soft-tissue]
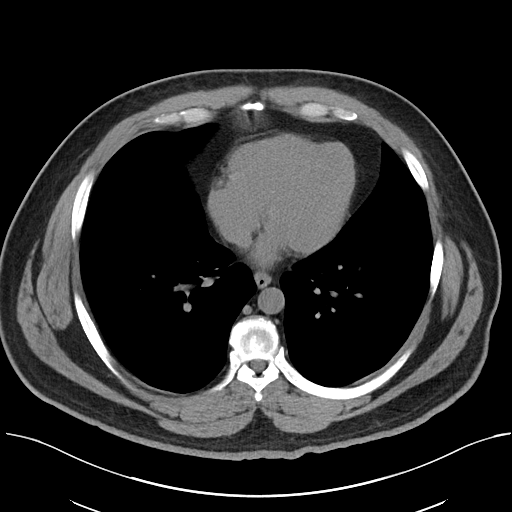

[Series 6: cor · coronal · 0.85mm/px · 3 of 119 slices shown]
[im 40/119  soft-tissue]
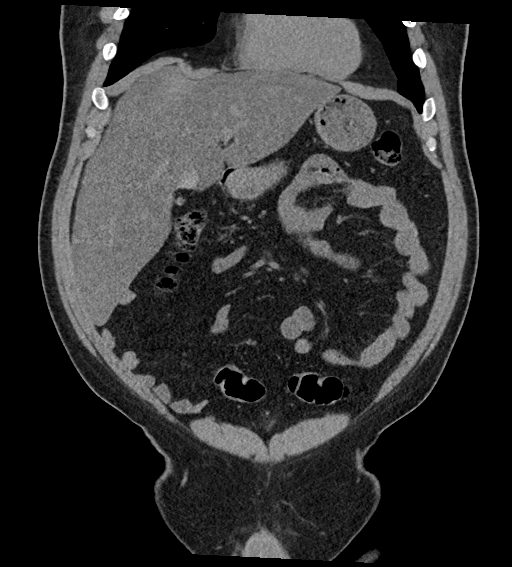
[im 53/119  soft-tissue]
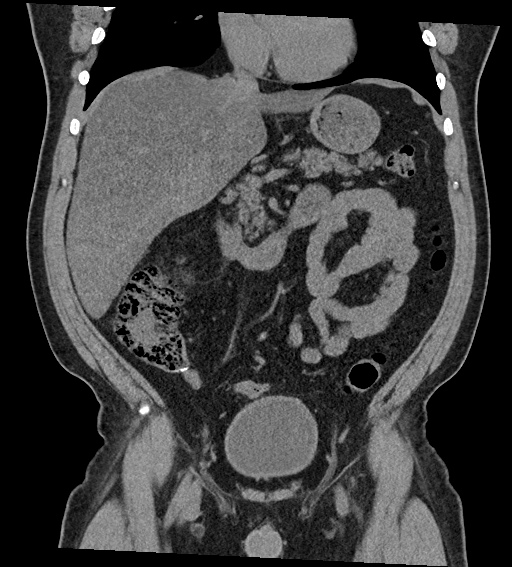
[im 66/119  soft-tissue]
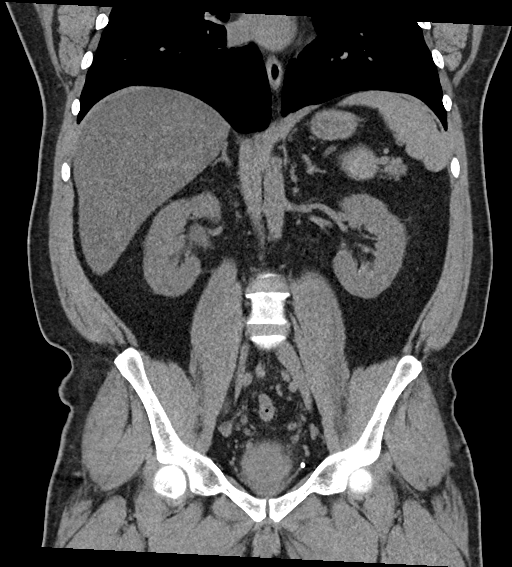

[16 of 46 positions shown; findings below may reference images not displayed]

FINDINGS: Lower chest: No acute abnormality. Several 2 mm pulmonary nodules in
both lower lobes are unchanged, benign.

Hepatobiliary: Unchanged diffusely decreased liver density with
focal sparing along the gallbladder fossa. No focal liver
abnormality. The gallbladder is decompressed. No biliary dilatation.

Pancreas: Unremarkable. No pancreatic ductal dilatation or
surrounding inflammatory changes.

Spleen: Normal in size without focal abnormality.

Adrenals/Urinary Tract: The adrenal glands and left kidney are
unremarkable. 4 mm calculus in the distal right ureter with mild
right hydroureteronephrosis and periureteral fat stranding. The
bladder is unremarkable for the degree of distention.

Stomach/Bowel: Stomach is within normal limits. Prior appendectomy.
No evidence of bowel wall thickening, distention, or inflammatory
changes.

Vascular/Lymphatic: No significant vascular findings are present. No
enlarged abdominal or pelvic lymph nodes.

Reproductive: Unchanged prostatic calcifications.

Other: No abdominal wall hernia or abnormality. No abdominopelvic
ascites. No pneumoperitoneum.

Musculoskeletal: No acute or significant osseous findings.
IMPRESSION: 1. 4 mm calculus in the distal right ureter with mild right
hydroureteronephrosis.
2. Unchanged hepatic steatosis.

## 2023-12-25 ENCOUNTER — Ambulatory Visit: Payer: Self-pay | Admitting: Nurse Practitioner

## 2023-12-27 ENCOUNTER — Encounter: Payer: Self-pay | Admitting: Nurse Practitioner

## 2023-12-27 ENCOUNTER — Ambulatory Visit: Payer: Self-pay | Attending: Nurse Practitioner | Admitting: Nurse Practitioner

## 2023-12-27 ENCOUNTER — Other Ambulatory Visit: Payer: Self-pay

## 2023-12-27 VITALS — BP 101/64 | HR 66 | Resp 19 | Ht 62.0 in | Wt 233.8 lb

## 2023-12-27 DIAGNOSIS — Z7984 Long term (current) use of oral hypoglycemic drugs: Secondary | ICD-10-CM

## 2023-12-27 DIAGNOSIS — E1165 Type 2 diabetes mellitus with hyperglycemia: Secondary | ICD-10-CM

## 2023-12-27 DIAGNOSIS — E785 Hyperlipidemia, unspecified: Secondary | ICD-10-CM

## 2023-12-27 DIAGNOSIS — G629 Polyneuropathy, unspecified: Secondary | ICD-10-CM

## 2023-12-27 DIAGNOSIS — G4733 Obstructive sleep apnea (adult) (pediatric): Secondary | ICD-10-CM

## 2023-12-27 MED ORDER — GLIMEPIRIDE 2 MG PO TABS
2.0000 mg | ORAL_TABLET | Freq: Every day | ORAL | 0 refills | Status: DC
  Filled 2023-12-27 – 2024-01-31 (×2): qty 90, 90d supply, fill #0

## 2023-12-27 NOTE — Progress Notes (Signed)
 Assessment & Plan:  Tristan Hall was seen today for sleep apnea.  Diagnoses and all orders for this visit:  OSA (obstructive sleep apnea) -     Home sleep test  Neuropathy -     Vitamin B12 -     VITAMIN D 25 Hydroxy (Vit-D Deficiency, Fractures)  Type 2 diabetes mellitus with hyperglycemia, without long-term current use of insulin (HCC) -     glimepiride  (AMARYL ) 2 MG tablet; Take 1 tablet (2 mg total) by mouth daily with breakfast. For diabetes -     Hemoglobin A1c  Dyslipidemia, goal LDL below 70 -     Lipid panel INSTRUCTIONS: Work on a low fat, heart healthy diet and participate in regular aerobic exercise program by working out at least 150 minutes per week; 5 days a week-30 minutes per day. Avoid red meat/beef/steak,  fried foods. junk foods, sodas, sugary drinks, unhealthy snacking, alcohol and smoking.  Drink at least 80 oz of water per day and monitor your carbohydrate intake daily.      Patient has been counseled on age-appropriate routine health concerns for screening and prevention. These are reviewed and up-to-date. Referrals have been placed accordingly. Immunizations are up-to-date or declined.    Subjective:   Chief Complaint  Patient presents with   Sleep Apnea    Tristan Hall 48 y.o. male presents to office today for OSA  VRI was used to communicate directly with patient for the entire encounter including providing detailed patient instructions.     He has a past medical history of DM Type 2, Hematuria, History of COVID-19 (2020), Hypercholesteremia, neuropathy, morbid obesity.    Sleep Apnea: Patient presents with possible obstructive sleep apnea. Patent has a several months history of symptoms of daytime fatigue and morning fatigue. Patient generally gets 4 hours of sleep per night, and states they generally have difficulty falling asleep, nightime awakenings, and difficulty falling back asleep if awakened. Snoring of moderate severity is  present. Apneic episodes are present.    He is prescribed renal dose of lisinopril  BP Readings from Last 3 Encounters:  12/27/23 101/64  09/18/23 119/77  06/07/23 124/77     He has been experiencing numbness and tingling in the 2nd, 3rd and 4th fingers of the left hand.  Occurring daily.  Reports symptoms as mild and declines gabapentin today.  States he does not feel the numbness or tingling when he is at work but does notice at rest.      Review of Systems  Constitutional:  Negative for fever, malaise/fatigue and weight loss.  HENT: Negative.  Negative for nosebleeds.   Eyes: Negative.  Negative for blurred vision, double vision and photophobia.  Respiratory: Negative.  Negative for cough and shortness of breath.   Cardiovascular: Negative.  Negative for chest pain, palpitations and leg swelling.  Gastrointestinal: Negative.  Negative for heartburn, nausea and vomiting.  Musculoskeletal: Negative.  Negative for myalgias.  Neurological:  Positive for tingling and sensory change. Negative for dizziness, focal weakness, seizures and headaches.  Psychiatric/Behavioral: Negative.  Negative for suicidal ideas.     Past Medical History:  Diagnosis Date   DM Type 2    pt does not check cbg diagnosed 05-2021 per pt   Hematuria    for last week @ times dr pace aware per pt   History of COVID-19 2020   internal fever loss of taste and smell, dizzy @ times when in sun for 2 months all symptoms resolved   Hypercholesteremia  Numbness 06/11/2021   right fingertips @ times    Past Surgical History:  Procedure Laterality Date   CYSTOSCOPY WITH RETROGRADE PYELOGRAM, URETEROSCOPY AND STENT PLACEMENT Right 05/27/2021   Procedure: CYSTOSCOPY WITH RETROGRADE PYELOGRAM, URETEROSCOPY AND STENT PLACEMENT;  Surgeon: Tristan Valli BIRCH, MD;  Location: [facility] OR;  Service: Urology;  Laterality: Right;   CYSTOSCOPY/URETEROSCOPY/HOLMIUM LASER/STENT PLACEMENT Right 06/15/2021   Procedure:  CYSTOSCOPY/URETEROSCOPY/HOLMIUM LASER/STENT EXCHANGE;  Surgeon: Tristan Valli BIRCH, MD;  [facility];  Service: Urology;  Laterality: Right;   LAPAROSCOPIC APPENDECTOMY N/A 08/13/2015   Procedure: APPENDECTOMY LAPAROSCOPIC;  Surgeon: Tristan Bury, MD;  Location: [facility] OR;  Service: General;  Laterality: N/A;    History reviewed. No pertinent family history.  Social History Reviewed with no changes to be made today.   Outpatient Medications Prior to Visit  Medication Sig Dispense Refill   atorvastatin  (LIPITOR) 20 MG tablet Take 1 tablet (20 mg total) by mouth daily. For cholesterol 90 tablet 3   Blood Glucose Monitoring Suppl (TRUE METRIX METER) w/Device KIT Use as instructed. Check blood glucose level by fingerstick twice per day. 1 kit 0   glucose blood (TRUE METRIX BLOOD GLUCOSE TEST) test strip Use as instructed. Check blood glucose level by fingerstick twice per day. 100 each 12   lisinopril  (ZESTRIL ) 5 MG tablet Take 1 tablet (5 mg total) by mouth daily. To protect kidneys 90 tablet 1   sildenafil  (VIAGRA ) 100 MG tablet Take 0.5-1 tablets (50-100 mg total) by mouth daily as needed for erectile dysfunction. 5 tablet 11   TRUEplus Lancets 28G MISC Use as instructed. Check blood glucose level by fingerstick twice per day. 100 each 3   glimepiride  (AMARYL ) 2 MG tablet Take 1 tablet (2 mg total) by mouth daily with breakfast. For diabetes 90 tablet 0   No facility-administered medications prior to visit.    No Known Allergies     Objective:    BP 101/64 (BP Location: Right Arm, Patient Position: Sitting, Cuff Size: Normal)   Pulse 66   Resp 19   Ht 5' 2 (1.575 m)   Wt 233 lb 12.8 oz (106.1 kg)   SpO2 93%   BMI 42.76 kg/m  Wt Readings from Last 3 Encounters:  12/27/23 233 lb 12.8 oz (106.1 kg)  09/18/23 223 lb 12.8 oz (101.5 kg)  06/07/23 221 lb 12.8 oz (100.6 kg)    Physical Exam Vitals and nursing note reviewed.  Constitutional:      Appearance:  He is well-developed.  HENT:     Head: Normocephalic and atraumatic.   Cardiovascular:     Rate and Rhythm: Normal rate and regular rhythm.     Heart sounds: Normal heart sounds. No murmur heard.    No friction rub. No gallop.  Pulmonary:     Effort: Pulmonary effort is normal. No tachypnea or respiratory distress.     Breath sounds: Normal breath sounds. No decreased breath sounds, wheezing, rhonchi or rales.  Chest:     Chest wall: No tenderness.  Abdominal:     General: Bowel sounds are normal.     Palpations: Abdomen is soft.   Musculoskeletal:        General: Normal range of motion.     Cervical back: Normal range of motion.   Skin:    General: Skin is warm and dry.   Neurological:     Mental Status: He is alert and oriented to person, place, and time.     Coordination: Coordination normal.  Psychiatric:        Behavior: Behavior normal. Behavior is cooperative.        Thought Content: Thought content normal.        Judgment: Judgment normal.          Patient has been counseled extensively about nutrition and exercise as well as the importance of adherence with medications and regular follow-up. The patient was given clear instructions to go to ER or return to medical center if symptoms don't improve, worsen or new problems develop. The patient verbalized understanding.   Follow-up: Return in about 6 months (around 06/27/2024) for NEEDS ORANGE CARD/CAFA APPLICATION.   Haze LELON Servant, FNP-BC [facility] and [facility] [place], [place] [phone]   12/27/2023, 4:04 PM

## 2023-12-28 LAB — LIPID PANEL
Chol/HDL Ratio: 5.2 ratio — ABNORMAL HIGH (ref 0.0–5.0)
Cholesterol, Total: 145 mg/dL (ref 100–199)
HDL: 28 mg/dL — ABNORMAL LOW (ref >39)
LDL Chol Calc (NIH): 78 mg/dL (ref 0–99)
Triglycerides: 234 mg/dL — ABNORMAL HIGH (ref 0–149)
VLDL Cholesterol Cal: 39 mg/dL (ref 5–40)

## 2023-12-28 LAB — HEMOGLOBIN A1C
Est. average glucose Bld gHb Est-mCnc: 151 mg/dL
Hgb A1c MFr Bld: 6.9 % — ABNORMAL HIGH (ref 4.8–5.6)

## 2023-12-28 LAB — VITAMIN D 25 HYDROXY (VIT D DEFICIENCY, FRACTURES): Vit D, 25-Hydroxy: 21.9 ng/mL — ABNORMAL LOW (ref 30.0–100.0)

## 2023-12-28 LAB — VITAMIN B12: Vitamin B-12: 331 pg/mL (ref 232–1245)

## 2023-12-30 ENCOUNTER — Other Ambulatory Visit: Payer: Self-pay

## 2023-12-30 ENCOUNTER — Other Ambulatory Visit: Payer: Self-pay | Admitting: Nurse Practitioner

## 2023-12-30 ENCOUNTER — Ambulatory Visit: Payer: Self-pay | Admitting: Nurse Practitioner

## 2023-12-30 DIAGNOSIS — E785 Hyperlipidemia, unspecified: Secondary | ICD-10-CM

## 2023-12-30 MED ORDER — ATORVASTATIN CALCIUM 40 MG PO TABS
40.0000 mg | ORAL_TABLET | Freq: Every day | ORAL | 3 refills | Status: AC
  Filled 2023-12-30: qty 90, 90d supply, fill #0
  Filled 2024-04-26: qty 90, 90d supply, fill #1

## 2024-01-01 ENCOUNTER — Other Ambulatory Visit: Payer: Self-pay

## 2024-01-03 ENCOUNTER — Other Ambulatory Visit: Payer: Self-pay

## 2024-01-31 ENCOUNTER — Other Ambulatory Visit: Payer: Self-pay

## 2024-04-26 ENCOUNTER — Other Ambulatory Visit: Payer: Self-pay

## 2024-04-26 ENCOUNTER — Other Ambulatory Visit: Payer: Self-pay | Admitting: Nurse Practitioner

## 2024-04-26 DIAGNOSIS — E1165 Type 2 diabetes mellitus with hyperglycemia: Secondary | ICD-10-CM

## 2024-04-26 DIAGNOSIS — R770 Abnormality of albumin: Secondary | ICD-10-CM

## 2024-04-27 MED ORDER — GLIMEPIRIDE 2 MG PO TABS
2.0000 mg | ORAL_TABLET | Freq: Every day | ORAL | 0 refills | Status: DC
  Filled 2024-04-27: qty 90, 90d supply, fill #0

## 2024-04-27 MED ORDER — TRUEPLUS LANCETS 28G MISC
3 refills | Status: AC
  Filled 2024-04-27: qty 100, 50d supply, fill #0
  Filled 2024-07-01: qty 100, 50d supply, fill #1

## 2024-04-27 MED ORDER — LISINOPRIL 5 MG PO TABS
5.0000 mg | ORAL_TABLET | Freq: Every day | ORAL | 0 refills | Status: DC
  Filled 2024-04-27: qty 90, 90d supply, fill #0

## 2024-04-27 MED ORDER — TRUE METRIX BLOOD GLUCOSE TEST VI STRP
ORAL_STRIP | 12 refills | Status: AC
  Filled 2024-04-27: qty 100, 50d supply, fill #0
  Filled 2024-07-01: qty 100, 50d supply, fill #1

## 2024-04-27 NOTE — Telephone Encounter (Signed)
 Requested Prescriptions  Pending Prescriptions Disp Refills   lisinopril  (ZESTRIL ) 5 MG tablet 90 tablet 0    Sig: Take 1 tablet (5 mg total) by mouth daily. To protect kidneys     Cardiovascular:  ACE Inhibitors Failed - 04/27/2024  9:50 PM      Failed - Cr in normal range and within 180 days    Creatinine, Ser  Date Value Ref Range Status  09/18/2023 0.94 0.76 - 1.27 mg/dL Final         Failed - K in normal range and within 180 days    Potassium  Date Value Ref Range Status  09/18/2023 4.7 3.5 - 5.2 mmol/L Final         Passed - Patient is not pregnant      Passed - Last BP in normal range    BP Readings from Last 1 Encounters:  12/27/23 101/64         Passed - Valid encounter within last 6 months    Recent Outpatient Visits           4 months ago OSA (obstructive sleep apnea)   [facility] [place]. [facility] Theotis Haze ORN, NP   7 months ago Diabetes mellitus treated with oral medication [facility])   [facility] Of [place]. [facility] [place], [place] W, NP   10 months ago Type 2 diabetes mellitus with hyperglycemia, without long-term current use of insulin (HCC)   [place] [place] [facility] Of [place]. [facility] [place], [place] W, NP   1 year ago Type 2 diabetes mellitus with hyperglycemia, without long-term current use of insulin (HCC)   [place] [facility] Dept Of [place]. [facility] [place], [place] W, NP   1 year ago Type 2 diabetes mellitus with hyperglycemia, without long-term current use of insulin (HCC)   [facility] [facility] Dept Of [place]. [facility] [place], [place] W, NP               glimepiride  (AMARYL ) 2 MG tablet 90 tablet 0    Sig: Take 1 tablet (2 mg total) by mouth daily with breakfast. For diabetes     Endocrinology:  Diabetes - Sulfonylureas Passed - 04/27/2024  9:50 PM      Passed -  HBA1C is between 0 and 7.9 and within 180 days    HbA1c, POC (controlled diabetic range)  Date Value Ref Range Status  06/07/2023 6.3 0.0 - 7.0 % Final   Hgb A1c MFr Bld  Date Value Ref Range Status  12/27/2023 6.9 (H) 4.8 - 5.6 % Final    Comment:             Prediabetes: 5.7 - 6.4          Diabetes: >6.4          Glycemic control for adults with diabetes: <7.0          Passed - Cr in normal range and within 360 days    Creatinine, Ser  Date Value Ref Range Status  09/18/2023 0.94 0.76 - 1.27 mg/dL Final         Passed - Valid encounter within last 6 months    Recent Outpatient Visits           4 months ago OSA (obstructive sleep apnea)  [facility] [place]. [facility] Theotis Haze ORN, NP   7 months ago Diabetes mellitus treated with oral medication [facility]   [facility] [facility] Of [place]. [facility] [place], [place] W, NP   10 months ago Type 2 diabetes mellitus with hyperglycemia, without long-term current use of insulin (HCC)   [place] [facility] Of [place]. [facility] [place], [place] W, NP   1 year ago Type 2 diabetes mellitus with hyperglycemia, without long-term current use of insulin (HCC)   [place] [facility] Dept Of [place]. [facility] [place], [place] W, NP   1 year ago Type 2 diabetes mellitus with hyperglycemia, without long-term current use of insulin (HCC)   [place] [facility] Dept Of [place]. [facility] [place], [place] W, NP               glucose blood (TRUE METRIX BLOOD GLUCOSE TEST) test strip 100 each 12    Sig: Use as instructed. Check blood glucose level by fingerstick twice per day.     Endocrinology: Diabetes - Testing Supplies Passed - 04/27/2024  9:50 PM      Passed - Valid encounter within last 12 months    Recent Outpatient Visits           4 months ago OSA (obstructive sleep  apnea)   [facility] [place]. [facility] Theotis Haze ORN, NP   7 months ago Diabetes mellitus treated with oral medication [facility])   [facility] Dept Of [place]. [facility] [place], [place] W, NP   10 months ago Type 2 diabetes mellitus with hyperglycemia, without long-term current use of insulin (HCC)   [facility] Dept Of [place]. [facility] [place], [place] W, NP   1 year ago Type 2 diabetes mellitus with hyperglycemia, without long-term current use of insulin (HCC)   [place] [facility] Dept Of [place]. [facility] [place], [place] W, NP   1 year ago Type 2 diabetes mellitus with hyperglycemia, without long-term current use of insulin (HCC)   [place] [facility] Dept Of [place]. [facility] [place], [place] W, NP               TRUEplus Lancets 28G MISC 100 each 3    Sig: Use as instructed. Check blood glucose level by fingerstick twice per day.     Endocrinology: Diabetes - Testing Supplies Passed - 04/27/2024  9:50 PM      Passed - Valid encounter within last 12 months    Recent Outpatient Visits           4 months ago OSA (obstructive sleep apnea)   [facility] Of [place]. [facility] Theotis Haze ORN, NP   7 months ago Diabetes mellitus treated with oral medication [facility])   [facility] Dept Of [place]. [facility] [place], [place] W, NP   10 months ago Type 2 diabetes mellitus with hyperglycemia, without long-term current use of insulin (HCC)   [place] [facility] - [facility] Dept Of . [facility] [place], [place]  W, NP   1 year ago Type 2 diabetes mellitus with hyperglycemia, without long-term current use of insulin (HCC)   [facility] [place] [facility] Dept Of [place]. [facility] [place], [place] W, NP   1  year ago Type 2 diabetes mellitus with hyperglycemia, without long-term current use of insulin (HCC)    [facility] - A Dept Of [place]. [facility] Theotis Haze ORN, [place]

## 2024-04-28 ENCOUNTER — Other Ambulatory Visit: Payer: Self-pay

## 2024-04-29 ENCOUNTER — Other Ambulatory Visit: Payer: Self-pay

## 2024-04-30 ENCOUNTER — Other Ambulatory Visit: Payer: Self-pay

## 2024-06-28 ENCOUNTER — Telehealth: Payer: Self-pay | Admitting: Nurse Practitioner

## 2024-06-28 NOTE — Telephone Encounter (Signed)
 Contacted patient; appointment confirmed for 12/29.

## 2024-06-29 ENCOUNTER — Encounter (HOSPITAL_COMMUNITY): Payer: Self-pay

## 2024-06-29 ENCOUNTER — Emergency Department (HOSPITAL_COMMUNITY)
Admission: EM | Admit: 2024-06-29 | Discharge: 2024-06-29 | Disposition: A | Discharge status: Home / Self Care | Payer: Self-pay | Attending: Emergency Medicine | Admitting: Emergency Medicine

## 2024-06-29 ENCOUNTER — Emergency Department (HOSPITAL_COMMUNITY): Payer: Self-pay

## 2024-06-29 ENCOUNTER — Other Ambulatory Visit: Payer: Self-pay

## 2024-06-29 DIAGNOSIS — W228XXA Striking against or struck by other objects, initial encounter: Secondary | ICD-10-CM | POA: Insufficient documentation

## 2024-06-29 DIAGNOSIS — S61411A Laceration without foreign body of right hand, initial encounter: Secondary | ICD-10-CM | POA: Insufficient documentation

## 2024-06-29 DIAGNOSIS — Y990 Civilian activity done for income or pay: Secondary | ICD-10-CM | POA: Insufficient documentation

## 2024-06-29 MED ORDER — LIDOCAINE HCL (PF) 1 % IJ SOLN
5.0000 mL | Freq: Once | INTRAMUSCULAR | Status: AC
  Administered 2024-06-29: 5 mL
  Filled 2024-06-29: qty 5

## 2024-06-29 MED ORDER — OXYCODONE-ACETAMINOPHEN 5-325 MG PO TABS
1.0000 | ORAL_TABLET | Freq: Four times a day (QID) | ORAL | 0 refills | Status: DC | PRN
  Filled 2024-06-29: qty 8, 2d supply, fill #0

## 2024-06-29 MED ORDER — CEPHALEXIN 500 MG PO CAPS
500.0000 mg | ORAL_CAPSULE | Freq: Four times a day (QID) | ORAL | 0 refills | Status: DC
  Filled 2024-06-29: qty 28, 7d supply, fill #0

## 2024-06-29 MED ORDER — OXYCODONE-ACETAMINOPHEN 5-325 MG PO TABS
1.0000 | ORAL_TABLET | ORAL | Status: AC | PRN
  Administered 2024-06-29 (×2): 1 via ORAL
  Filled 2024-06-29 (×2): qty 1

## 2024-06-29 MED ORDER — CEPHALEXIN 250 MG PO CAPS
500.0000 mg | ORAL_CAPSULE | Freq: Once | ORAL | Status: AC
  Administered 2024-06-29: 500 mg via ORAL
  Filled 2024-06-29: qty 2

## 2024-06-29 NOTE — ED Notes (Signed)
Pt soaking hand in saline and betadine.  

## 2024-06-29 NOTE — Discharge Instructions (Signed)
 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 Regrese al veda en [address] y [address] la zona vendada, usando guantes tambin. Tome el antibitico segn lo recetado para prevenir infecciones. Los puntos de sutura debern retirarse en [address] a [address]; puede hacerlo con su mdico de cabecera. Regrese a la sala de emergencias si es necesario.  As we discussed, the wound must be kept clean and dry. You can return to work in 5 days, but keep the area bandaged and wear gloves as well. Take the antibiotic as prescribed to prevent infection. The stitches should be removed in 7 to 10 days; you can have this done by your primary care physician. Return to the emergency room if necessary.

## 2024-06-29 NOTE — ED Triage Notes (Signed)
 Pt was working when he lacerated his right hand on the palm near his thumb, bleeding controlled at this time. Pt reports last tetanus was this year

## 2024-06-29 NOTE — ED Provider Triage Note (Signed)
 Emergency Medicine Provider Triage Evaluation Note  Tristan Hall , a 48 y.o. male  was evaluated in triage.  Pt complains of laceration.  Patient notes that he was using a tool that is used to compress springs, the tool accidentally turned on and cut the palmar aspect of his right hand.  He denies numbness/tingling but endorses pain with movement of his thumb/index finger/middle finger. Tdap UTD as of 2020.  Review of Systems  Positive: As above Negative: As above  Physical Exam  BP (!) 150/93   Pulse 88   Temp 98.5 F (36.9 C)   Resp 16   Ht 5' 2 (1.575 m)   Wt 99.8 kg   SpO2 96%   BMI 40.24 kg/m  Gen:   Awake, no distress   Resp:  Normal effort  MSK:   Moves extremities without difficulty  Other:    Medical Decision Making  Medically screening exam initiated at 1:35 PM.  Appropriate orders placed.  Tristan Hall was informed that the remainder of the evaluation will be completed by another provider, this initial triage assessment does not replace that evaluation, and the importance of remaining in the ED until their evaluation is complete.     Tristan Hall SAILOR, [place] 06/29/24 1337

## 2024-06-29 NOTE — ED Notes (Signed)
 Wound cleaned, dressed with nonadherent dressing & kling. Pt tolerated well.

## 2024-06-29 NOTE — ED Provider Notes (Signed)
 " [place] [facility] EMERGENCY DEPARTMENT AT  HOSPITAL Provider Note   CSN: [id number] Arrival date & time: 06/29/24  1131     Patient presents with: Extremity Laceration   Tristan Hall is a 48 y.o. male.   Patient to ED for treatment of right hand laceration. 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 No other injury. Tetanus is reported as UTD.  The history is provided by the patient. No language interpreter was used.       Prior to Admission medications  Medication Sig Start Date End Date Taking? Authorizing Provider  cephALEXin  (KEFLEX ) 500 MG capsule Take 1 capsule (500 mg total) by mouth 4 (four) times daily. 06/29/24  Yes Richy Spradley, Margit, PA-C  oxyCODONE -acetaminophen  (PERCOCET/ROXICET) 5-325 MG tablet Take 1 tablet by mouth every 6 (six) hours as needed for severe pain (pain score 7-10). 06/29/24  Yes Odell Margit, PA-C  atorvastatin  (LIPITOR) 40 MG tablet Take 1 tablet (40 mg total) by mouth daily. For cholesterol 12/30/23   Fleming, Zelda W, NP  Blood Glucose Monitoring Suppl (TRUE METRIX METER) w/Device KIT Use as instructed. Check blood glucose level by fingerstick twice per day. 03/08/23   Fleming, Zelda W, NP  glimepiride  (AMARYL ) 2 MG tablet Take 1 tablet (2 mg total) by mouth daily with breakfast. For diabetes 04/27/24   Fleming, Zelda W, NP  glucose blood (TRUE METRIX BLOOD GLUCOSE TEST) test strip Use as instructed. Check blood glucose level by fingerstick twice per day. 04/27/24   Fleming, Zelda W, NP  lisinopril  (ZESTRIL ) 5 MG tablet Take 1 tablet (5 mg total) by mouth daily. To protect kidneys 04/27/24   Fleming, Zelda W, NP  sildenafil  (VIAGRA ) 100 MG tablet Take 0.5-1 tablets (50-100 mg total) by mouth daily as needed for erectile dysfunction. 03/08/23   Fleming, Zelda W, NP  TRUEplus Lancets 28G MISC Use as instructed. Check blood glucose level by  fingerstick twice per day. 04/27/24   Fleming, Zelda W, NP    Allergies: Patient has no known allergies.    Review of Systems  Updated Vital Signs BP 131/89   Pulse 67   Temp 97.8 F (36.6 C) (Oral)   Resp 17   Ht 5' 2 (1.575 m)   Wt 99.8 kg   SpO2 98%   BMI 40.24 kg/m   Physical Exam Constitutional:      Appearance: He is well-developed.  Pulmonary:     Effort: Pulmonary effort is normal.  Musculoskeletal:        General: Normal range of motion.     Cervical back: Normal range of motion.     Comments: FROM all digits of the right hand without tendon deficits.   Skin:    General: Skin is warm and dry.     Comments: 3 cm laceration right palmar hand between thumb and index finger.   Neurological:     Mental Status: He is alert and oriented to person, place, and time.     Sensory: No sensory deficit.     (all labs ordered are listed, but only abnormal results are displayed) Labs Reviewed - No data to display  EKG: None  Radiology: DG Hand Complete Right Result Date: 06/29/2024 CLINICAL DATA:  RIGHT hand laceration. EXAM: RIGHT HAND - COMPLETE 3+ VIEW COMPARISON:  None available FINDINGS: Diffuse soft tissue swelling. No fracture, dislocation, radiopaque foreign body. IMPRESSION: Diffuse soft tissue swelling of the  RIGHT hand without acute osseous abnormality. Electronically Signed   By: Aliene Lloyd M.D.   On: 06/29/2024 13:31     .Laceration Repair  Date/Time: 06/29/2024 7:18 PM  Performed by: Odell Balls, PA-C Authorized by: Odell Balls, PA-C   Consent:    Consent obtained:  Verbal Universal protocol:    Procedure explained and questions answered to patient or proxy's satisfaction: yes     Patient identity confirmed:  Verbally with patient Laceration details:    Location:  Hand   Hand location:  R palm   Length (cm):  3 Pre-procedure details:    Preparation:  Patient was prepped and draped in usual sterile fashion and imaging obtained to  evaluate for foreign bodies Treatment:    Amount of cleaning:  Extensive   Irrigation solution:  Sterile saline   Irrigation method:  Pressure wash   Debridement:  Minimal Skin repair:    Repair method:  Sutures   Suture size:  3-0   Suture material:  Prolene   Suture technique:  Simple interrupted   Number of sutures:  5 Approximation:    Approximation:  Close Repair type:    Repair type:  Simple    Medications Ordered in the ED  cephALEXin  (KEFLEX ) capsule 500 mg (has no administration in time range)  oxyCODONE -acetaminophen  (PERCOCET/ROXICET) 5-325 MG per tablet 1 tablet (1 tablet Oral Given 06/29/24 1832)  lidocaine  (PF) (XYLOCAINE ) 1 % injection 5 mL (5 mLs Infiltration Given 06/29/24 1727)    Clinical Course as of 06/29/24 1924  Sat Jun 29, 2024  1919 Patient to ED with laceration of right hand as detailed in HPI. Repaired per procedure notation. Pain managed. He is a curator, hands chronically dirty/soiled. Will prescribe antibiotics and care instructions.  [SU]    Clinical Course User Index [SU] Odell Balls, PA-C                                 Medical Decision Making Amount and/or Complexity of Data Reviewed Radiology: ordered.  Risk Prescription drug management.        Final diagnoses:  Laceration of right hand without foreign body, initial encounter    ED Discharge Orders          Ordered    cephALEXin  (KEFLEX ) 500 MG capsule  4 times daily        06/29/24 1921    oxyCODONE -acetaminophen  (PERCOCET/ROXICET) 5-325 MG tablet  Every 6 hours PRN        06/29/24 1921               Odell Balls, PA-C 06/29/24 1924  "

## 2024-06-30 ENCOUNTER — Other Ambulatory Visit: Payer: Self-pay

## 2024-07-01 ENCOUNTER — Other Ambulatory Visit (HOSPITAL_COMMUNITY)
Admission: RE | Admit: 2024-07-01 | Discharge: 2024-07-01 | Disposition: A | Discharge status: Home / Self Care | Payer: Self-pay | Source: Ambulatory Visit | Attending: Nurse Practitioner | Admitting: Nurse Practitioner

## 2024-07-01 ENCOUNTER — Other Ambulatory Visit: Payer: Self-pay

## 2024-07-01 ENCOUNTER — Ambulatory Visit: Payer: Self-pay | Attending: Nurse Practitioner | Admitting: Nurse Practitioner

## 2024-07-01 ENCOUNTER — Encounter: Payer: Self-pay | Admitting: Nurse Practitioner

## 2024-07-01 VITALS — BP 109/69 | HR 76 | Ht 62.0 in | Wt 224.0 lb

## 2024-07-01 DIAGNOSIS — Z7984 Long term (current) use of oral hypoglycemic drugs: Secondary | ICD-10-CM

## 2024-07-01 DIAGNOSIS — R770 Abnormality of albumin: Secondary | ICD-10-CM

## 2024-07-01 DIAGNOSIS — S61411A Laceration without foreign body of right hand, initial encounter: Secondary | ICD-10-CM

## 2024-07-01 DIAGNOSIS — Z7251 High risk heterosexual behavior: Secondary | ICD-10-CM

## 2024-07-01 DIAGNOSIS — R7989 Other specified abnormal findings of blood chemistry: Secondary | ICD-10-CM

## 2024-07-01 DIAGNOSIS — E1165 Type 2 diabetes mellitus with hyperglycemia: Secondary | ICD-10-CM

## 2024-07-01 DIAGNOSIS — Z1211 Encounter for screening for malignant neoplasm of colon: Secondary | ICD-10-CM

## 2024-07-01 LAB — POCT GLYCOSYLATED HEMOGLOBIN (HGB A1C): Hemoglobin A1C: 7.7 % — AB (ref 4.0–5.6)

## 2024-07-01 MED ORDER — LISINOPRIL 5 MG PO TABS
5.0000 mg | ORAL_TABLET | Freq: Every day | ORAL | 1 refills | Status: AC
  Filled 2024-07-01: qty 90, 90d supply, fill #0

## 2024-07-01 MED ORDER — MUPIROCIN 2 % EX OINT
1.0000 | TOPICAL_OINTMENT | Freq: Two times a day (BID) | CUTANEOUS | 1 refills | Status: AC
  Filled 2024-07-01: qty 22, 11d supply, fill #0

## 2024-07-01 MED ORDER — GLIMEPIRIDE 2 MG PO TABS
2.0000 mg | ORAL_TABLET | Freq: Every day | ORAL | 1 refills | Status: DC
  Filled 2024-07-01: qty 90, 90d supply, fill #0

## 2024-07-01 MED ORDER — GLIMEPIRIDE 4 MG PO TABS
4.0000 mg | ORAL_TABLET | Freq: Every day | ORAL | 1 refills | Status: AC
  Filled 2024-07-01: qty 90, 90d supply, fill #0

## 2024-07-01 NOTE — Progress Notes (Signed)
 "  Assessment & Plan:  Tristan Hall was seen today for diabetes.  Diagnoses and all orders for this visit:  Type 2 diabetes mellitus with hyperglycemia, without long-term current use of insulin  Dose change: Increase glimepiride  to 4 mg -     POCT glycosylated hemoglobin (Hb A1C) -     CMP14+EGFR -     Urine Albumin/Creatinine with ratio (send out) [LAB689] -     glimepiride  (AMARYL ) 4 MG tablet; Take 1 tablet (4 mg total) by mouth daily with breakfast. For diabetes  Colon cancer screening -     Fecal occult blood, imunochemical  Abnormal presence of albumin -     lisinopril  (ZESTRIL ) 5 MG tablet; Take 1 tablet (5 mg total) by mouth daily. To protect kidneys  Abnormal CBC -     CBC with Differential/Platelet  High risk heterosexual behavior -     Urine cytology ancillary only -     HIV Antibody (routine testing w rflx) -     RPR W/RFLX TO RPR TITER, TREPONEMAL AB, SCREEN AND DIAGNOSIS  Laceration of right hand without foreign body, initial encounter -     mupirocin ointment (BACTROBAN) 2 %; Apply 1 Application topically 2 (two) times daily.    Patient has been counseled on age-appropriate routine health concerns for screening and prevention. These are reviewed and up-to-date. Referrals have been placed accordingly. Immunizations are up-to-date or declined.    Subjective:   Chief Complaint  Patient presents with   Diabetes    Tristan Hall 48 y.o. male presents to office today for follow up to DM and HFU.  VRI was used to communicate directly with patient for the entire encounter including providing detailed patient instructions.    He was seen in the ED on 06-29-2024 with right hand laceration. He has been working in [place] and his hand slipped hitting a piece of machinery causing a laceration to the first interphalangeal space.  He required 5 Prolene sutures. Will need to return next week for suture removal.  He was also prescribed Keflex  which he has not picked up  as of today but states he will be picking up from the pharmacy after his office visit.   DM 2 A1c has increased from 6.9 and currently 7.7.  He is currently taking 2 mg of glimepiride  daily as prescribed.  Could not tolerate metformin  due to GI upset/diarrhea Lab Results  Component Value Date   HGBA1C 7.7 (A) 07/01/2024    Lab Results  Component Value Date   HGBA1C 6.9 (H) 12/27/2023       He is requesting full panel for STI testing.  Denies any current symptoms but states that he has not been tested in quite some time and he would like testing today     Review of Systems  Constitutional:  Negative for fever, malaise/fatigue and weight loss.  HENT: Negative.  Negative for nosebleeds.   Eyes: Negative.  Negative for blurred vision, double vision and photophobia.  Respiratory: Negative.  Negative for cough and shortness of breath.   Cardiovascular: Negative.  Negative for chest pain, palpitations and leg swelling.  Gastrointestinal: Negative.  Negative for heartburn, nausea and vomiting.  Musculoskeletal: Negative.  Negative for myalgias.  Skin:        laceration  Neurological: Negative.  Negative for dizziness, focal weakness, seizures and headaches.  Psychiatric/Behavioral: Negative.  Negative for suicidal ideas.     Past Medical History:  Diagnosis Date   DM Type 2  pt does not check cbg diagnosed 05-2021 per pt   Hematuria    for last week @ times dr pace aware per pt   History of COVID-19 2020   internal fever loss of taste and smell, dizzy @ times when in sun for 2 months all symptoms resolved   Hypercholesteremia    Numbness 06/11/2021   right fingertips @ times    Past Surgical History:  Procedure Laterality Date   CYSTOSCOPY WITH RETROGRADE PYELOGRAM, URETEROSCOPY AND STENT PLACEMENT Right 05/27/2021   Procedure: CYSTOSCOPY WITH RETROGRADE PYELOGRAM, URETEROSCOPY AND STENT PLACEMENT;  Surgeon: Elisabeth Valli BIRCH, MD;  Location: [facility] OR;  Service: Urology;   Laterality: Right;   CYSTOSCOPY/URETEROSCOPY/HOLMIUM LASER/STENT PLACEMENT Right 06/15/2021   Procedure: CYSTOSCOPY/URETEROSCOPY/HOLMIUM LASER/STENT EXCHANGE;  Surgeon: Elisabeth Valli BIRCH, MD;  Location: [facility];  Service: Urology;  Laterality: Right;   LAPAROSCOPIC APPENDECTOMY N/A 08/13/2015   Procedure: APPENDECTOMY LAPAROSCOPIC;  Surgeon: Donnice Bury, MD;  Location: [facility] OR;  Service: General;  Laterality: N/A;    No family history on file.  Social History Reviewed with no changes to be made today.   Outpatient Medications Prior to Visit  Medication Sig Dispense Refill   atorvastatin  (LIPITOR) 40 MG tablet Take 1 tablet (40 mg total) by mouth daily. For cholesterol 90 tablet 3   Blood Glucose Monitoring Suppl (TRUE METRIX METER) w/Device KIT Use as instructed. Check blood glucose level by fingerstick twice per day. 1 kit 0   glucose blood (TRUE METRIX BLOOD GLUCOSE TEST) test strip Use as instructed. Check blood glucose level by fingerstick twice per day. 100 each 12   oxyCODONE -acetaminophen  (PERCOCET/ROXICET) 5-325 MG tablet Take 1 tablet by mouth every 6 (six) hours as needed for severe pain (pain score 7-10). 8 tablet 0   TRUEplus Lancets 28G MISC Use as instructed. Check blood glucose level by fingerstick twice per day. 100 each 3   glimepiride  (AMARYL ) 2 MG tablet Take 1 tablet (2 mg total) by mouth daily with breakfast. For diabetes 90 tablet 0   lisinopril  (ZESTRIL ) 5 MG tablet Take 1 tablet (5 mg total) by mouth daily. To protect kidneys 90 tablet 0   cephALEXin  (KEFLEX ) 500 MG capsule Take 1 capsule (500 mg total) by mouth 4 (four) times daily. (Patient not taking: Reported on 07/01/2024) 28 capsule 0   sildenafil  (VIAGRA ) 100 MG tablet Take 0.5-1 tablets (50-100 mg total) by mouth daily as needed for erectile dysfunction. (Patient not taking: Reported on 07/01/2024) 5 tablet 11   No facility-administered medications prior to visit.    Allergies[1]      Objective:    BP 109/69 (BP Location: Left Arm, Patient Position: Sitting, Cuff Size: Normal)   Pulse 76   Ht 5' 2 (1.575 m)   Wt 224 lb (101.6 kg)   SpO2 98%   BMI 40.97 kg/m  Wt Readings from Last 3 Encounters:  07/01/24 224 lb (101.6 kg)  06/29/24 220 lb (99.8 kg)  12/27/23 233 lb 12.8 oz (106.1 kg)    Physical Exam Vitals and nursing note reviewed.  Constitutional:      Appearance: He is well-developed.  HENT:     Head: Normocephalic and atraumatic.  Cardiovascular:     Rate and Rhythm: Normal rate and regular rhythm.     Heart sounds: Normal heart sounds. No murmur heard.    No friction rub. No gallop.  Pulmonary:     Effort: Pulmonary effort is normal. No tachypnea or respiratory distress.  Breath sounds: Normal breath sounds. No decreased breath sounds, wheezing, rhonchi or rales.  Chest:     Chest wall: No tenderness.  Musculoskeletal:        General: Normal range of motion.     Right hand: Laceration present.       Arms:     Cervical back: Normal range of motion.  Skin:    General: Skin is warm and dry.  Neurological:     Mental Status: He is alert and oriented to person, place, and time.     Coordination: Coordination normal.  Psychiatric:        Behavior: Behavior normal. Behavior is cooperative.        Thought Content: Thought content normal.        Judgment: Judgment normal.          Patient has been counseled extensively about nutrition and exercise as well as the importance of adherence with medications and regular follow-up. The patient was given clear instructions to go to ER or return to medical center if symptoms don't improve, worsen or new problems develop. The patient verbalized understanding.   Follow-up: Return for return 07-11-2023 for suture removal. 1110 doublebook .   Haze LELON Servant, FNP-BC [facility] [place], [place] [phone]   07/01/2024, 1:19 PM     [1] No Known Allergies  "

## 2024-07-02 ENCOUNTER — Ambulatory Visit: Payer: Self-pay | Admitting: Nurse Practitioner

## 2024-07-02 LAB — CBC WITH DIFFERENTIAL/PLATELET
Basophils Absolute: 0 x10E3/uL (ref 0.0–0.2)
Basos: 0 %
EOS (ABSOLUTE): 0.2 x10E3/uL (ref 0.0–0.4)
Eos: 3 %
Hematocrit: 48 % (ref 37.5–51.0)
Hemoglobin: 16.1 g/dL (ref 13.0–17.7)
Immature Grans (Abs): 0 x10E3/uL (ref 0.0–0.1)
Immature Granulocytes: 0 %
Lymphocytes Absolute: 1.8 x10E3/uL (ref 0.7–3.1)
Lymphs: 26 %
MCH: 30.1 pg (ref 26.6–33.0)
MCHC: 33.5 g/dL (ref 31.5–35.7)
MCV: 90 fL (ref 79–97)
Monocytes Absolute: 0.6 x10E3/uL (ref 0.1–0.9)
Monocytes: 8 %
Neutrophils Absolute: 4.3 x10E3/uL (ref 1.4–7.0)
Neutrophils: 63 %
Platelets: 214 x10E3/uL (ref 150–450)
RBC: 5.35 x10E6/uL (ref 4.14–5.80)
RDW: 12.8 % (ref 11.6–15.4)
WBC: 6.9 x10E3/uL (ref 3.4–10.8)

## 2024-07-02 LAB — SYPHILIS: RPR W/REFLEX TO RPR TITER AND TREPONEMAL ANTIBODIES, TRADITIONAL SCREENING AND DIAGNOSIS ALGORITHM: RPR Ser Ql: NONREACTIVE

## 2024-07-02 LAB — CMP14+EGFR
ALT: 35 IU/L (ref 0–44)
AST: 29 IU/L (ref 0–40)
Albumin: 4.7 g/dL (ref 4.1–5.1)
Alkaline Phosphatase: 86 IU/L (ref 47–123)
BUN/Creatinine Ratio: 19 (ref 9–20)
BUN: 16 mg/dL (ref 6–24)
Bilirubin Total: 0.4 mg/dL (ref 0.0–1.2)
CO2: 21 mmol/L (ref 20–29)
Calcium: 9.7 mg/dL (ref 8.7–10.2)
Chloride: 101 mmol/L (ref 96–106)
Creatinine, Ser: 0.84 mg/dL (ref 0.76–1.27)
Globulin, Total: 2.7 g/dL (ref 1.5–4.5)
Glucose: 139 mg/dL — ABNORMAL HIGH (ref 70–99)
Potassium: 4.9 mmol/L (ref 3.5–5.2)
Sodium: 141 mmol/L (ref 134–144)
Total Protein: 7.4 g/dL (ref 6.0–8.5)
eGFR: 109 mL/min/1.73

## 2024-07-02 LAB — URINE CYTOLOGY ANCILLARY ONLY
Chlamydia: NEGATIVE
Comment: NEGATIVE
Comment: NEGATIVE
Comment: NORMAL
Neisseria Gonorrhea: NEGATIVE
Trichomonas: NEGATIVE

## 2024-07-02 LAB — MICROALBUMIN / CREATININE URINE RATIO
Creatinine, Urine: 146.6 mg/dL
Microalb/Creat Ratio: 36 mg/g{creat} — ABNORMAL HIGH (ref 0–29)
Microalbumin, Urine: 52.8 ug/mL

## 2024-07-02 LAB — HIV ANTIBODY (ROUTINE TESTING W REFLEX): HIV Screen 4th Generation wRfx: NONREACTIVE

## 2024-07-10 ENCOUNTER — Ambulatory Visit: Payer: Self-pay | Attending: Nurse Practitioner | Admitting: Nurse Practitioner

## 2024-07-10 VITALS — BP 106/69 | HR 71 | Ht 62.0 in | Wt 225.5 lb

## 2024-07-10 DIAGNOSIS — Z4802 Encounter for removal of sutures: Secondary | ICD-10-CM

## 2024-07-10 NOTE — Progress Notes (Signed)
 "  Assessment & Plan:  Tristan Hall was seen today for suture / staple removal.  Diagnoses and all orders for this visit:  Visit for suture removal    Patient has been counseled on age-appropriate routine health concerns for screening and prevention. These are reviewed and up-to-date. Referrals have been placed accordingly. Immunizations are up-to-date or declined.    Subjective:   Chief Complaint  Patient presents with   Suture / Staple Removal    Tristan Hall 49 y.o. male presents to office today for suture removal.   [name] sustained a right hand laceration on 06-29-2024. He received 5 sutures in his hand on the same day and is here today for removal.   Right hand was prepped with betadine and  then alcohol. 5 sutures successfully removed There was significant eschar over the laceration.  He recently completed kelfex no signs of infection today     Review of Systems  Constitutional:  Negative for fever, malaise/fatigue and weight loss.  HENT: Negative.  Negative for nosebleeds.   Eyes: Negative.  Negative for blurred vision, double vision and photophobia.  Respiratory: Negative.  Negative for cough and shortness of breath.   Cardiovascular: Negative.  Negative for chest pain, palpitations and leg swelling.  Gastrointestinal: Negative.  Negative for heartburn, nausea and vomiting.  Musculoskeletal: Negative.  Negative for myalgias.  Skin:        Right hand laceration  Neurological: Negative.  Negative for dizziness, focal weakness, seizures and headaches.  Psychiatric/Behavioral: Negative.  Negative for suicidal ideas.     Past Medical History:  Diagnosis Date   DM Type 2    pt does not check cbg diagnosed 05-2021 per pt   Hematuria    for last week @ times dr pace aware per pt   History of COVID-19 2020   internal fever loss of taste and smell, dizzy @ times when in sun for 2 months all symptoms resolved   Hypercholesteremia    Numbness  06/11/2021   right fingertips @ times    Past Surgical History:  Procedure Laterality Date   CYSTOSCOPY WITH RETROGRADE PYELOGRAM, URETEROSCOPY AND STENT PLACEMENT Right 05/27/2021   Procedure: CYSTOSCOPY WITH RETROGRADE PYELOGRAM, URETEROSCOPY AND STENT PLACEMENT;  Surgeon: Elisabeth Valli BIRCH, MD;  Location: [facility] OR;  Service: Urology;  Laterality: Right;   CYSTOSCOPY/URETEROSCOPY/HOLMIUM LASER/STENT PLACEMENT Right 06/15/2021   Procedure: CYSTOSCOPY/URETEROSCOPY/HOLMIUM LASER/STENT EXCHANGE;  Surgeon: Elisabeth Valli BIRCH, MD;  Location: [facility];  Service: Urology;  Laterality: Right;   LAPAROSCOPIC APPENDECTOMY N/A 08/13/2015   Procedure: APPENDECTOMY LAPAROSCOPIC;  Surgeon: Donnice Bury, MD;  Location: [facility] OR;  Service: General;  Laterality: N/A;    No family history on file.  Social History Reviewed with no changes to be made today.   Outpatient Medications Prior to Visit  Medication Sig Dispense Refill   atorvastatin  (LIPITOR) 40 MG tablet Take 1 tablet (40 mg total) by mouth daily. For cholesterol 90 tablet 3   Blood Glucose Monitoring Suppl (TRUE METRIX METER) w/Device KIT Use as instructed. Check blood glucose level by fingerstick twice per day. 1 kit 0   glimepiride  (AMARYL ) 4 MG tablet Take 1 tablet (4 mg total) by mouth daily with breakfast. For diabetes 90 tablet 1   glucose blood (TRUE METRIX BLOOD GLUCOSE TEST) test strip Use as instructed. Check blood glucose level by fingerstick twice per day. 100 each 12   lisinopril  (ZESTRIL ) 5 MG tablet Take 1 tablet (5 mg total) by mouth daily.  To protect kidneys 90 tablet 1   mupirocin  ointment (BACTROBAN ) 2 % Apply 1 Application topically 2 (two) times daily. 22 g 1   TRUEplus Lancets 28G MISC Use as instructed. Check blood glucose level by fingerstick twice per day. 100 each 3   cephALEXin  (KEFLEX ) 500 MG capsule Take 1 capsule (500 mg total) by mouth 4 (four) times daily. (Patient not taking: Reported on 07/10/2024) 28  capsule 0   oxyCODONE -acetaminophen  (PERCOCET/ROXICET) 5-325 MG tablet Take 1 tablet by mouth every 6 (six) hours as needed for severe pain (pain score 7-10). (Patient not taking: Reported on 07/10/2024) 8 tablet 0   sildenafil  (VIAGRA ) 100 MG tablet Take 0.5-1 tablets (50-100 mg total) by mouth daily as needed for erectile dysfunction. (Patient not taking: Reported on 07/10/2024) 5 tablet 11   No facility-administered medications prior to visit.    Allergies[1]     Objective:    BP 106/69 (BP Location: Left Arm, Patient Position: Sitting, Cuff Size: Normal)   Pulse 71   Ht 5' 2 (1.575 m)   Wt 225 lb 7.4 oz (102.3 kg)   SpO2 96%   BMI 41.24 kg/m  Wt Readings from Last 3 Encounters:  07/10/24 225 lb 7.4 oz (102.3 kg)  07/01/24 224 lb (101.6 kg)  06/29/24 220 lb (99.8 kg)    Physical Exam Vitals and nursing note reviewed.  Constitutional:      Appearance: He is well-developed.  Cardiovascular:     Rate and Rhythm: Normal rate and regular rhythm.     Heart sounds: Normal heart sounds. No murmur heard.    No friction rub. No gallop.  Pulmonary:     Effort: Pulmonary effort is normal. No tachypnea or respiratory distress.     Breath sounds: Normal breath sounds. No decreased breath sounds, wheezing, rhonchi or rales.  Chest:     Chest wall: No tenderness.  Skin:    General: Skin is warm and dry.     Findings: Laceration present.      Neurological:     Mental Status: He is alert and oriented to person, place, and time.     Coordination: Coordination normal.  Psychiatric:        Behavior: Behavior normal. Behavior is cooperative.        Thought Content: Thought content normal.        Judgment: Judgment normal.          Patient has been counseled extensively about nutrition and exercise as well as the importance of adherence with medications and regular follow-up. The patient was given clear instructions to go to ER or return to medical center if symptoms don't improve,  worsen or new problems develop. The patient verbalized understanding.   Follow-up: No follow-ups on file.   Haze LELON Servant, FNP-BC [facility] [place], [place] [phone]   07/10/2024, 1:49 PM     [1] No Known Allergies  "

## 2024-10-25 ENCOUNTER — Ambulatory Visit: Payer: Self-pay | Admitting: Nurse Practitioner

## 2024-10-28 ENCOUNTER — Ambulatory Visit: Payer: Self-pay | Admitting: Nurse Practitioner
